# Patient Record
Sex: Male | Born: 1975 | Race: Black or African American | Hispanic: No | Marital: Married | Smoking: Current some day smoker
Health system: Southern US, Community
[De-identification: ages and names within clinical notes are randomized; demographics above are authoritative.]

## PROBLEM LIST (undated history)

## (undated) DIAGNOSIS — M1990 Unspecified osteoarthritis, unspecified site: Secondary | ICD-10-CM

## (undated) DIAGNOSIS — J45909 Unspecified asthma, uncomplicated: Secondary | ICD-10-CM

## (undated) DIAGNOSIS — K219 Gastro-esophageal reflux disease without esophagitis: Secondary | ICD-10-CM

## (undated) DIAGNOSIS — D649 Anemia, unspecified: Secondary | ICD-10-CM

## (undated) HISTORY — DX: Unspecified osteoarthritis, unspecified site: M19.90

## (undated) HISTORY — PX: OTHER SURGICAL HISTORY: SHX169

## (undated) HISTORY — DX: Anemia, unspecified: D64.9

## (undated) HISTORY — DX: Gastro-esophageal reflux disease without esophagitis: K21.9

---

## 1997-05-20 ENCOUNTER — Emergency Department (HOSPITAL_COMMUNITY): Admission: EM | Admit: 1997-05-20 | Discharge: 1997-05-20 | Payer: Self-pay | Admitting: Emergency Medicine

## 1997-05-24 ENCOUNTER — Emergency Department (HOSPITAL_COMMUNITY): Admission: EM | Admit: 1997-05-24 | Discharge: 1997-05-24 | Payer: Self-pay | Admitting: Emergency Medicine

## 1998-02-27 ENCOUNTER — Emergency Department (HOSPITAL_COMMUNITY): Admission: EM | Admit: 1998-02-27 | Discharge: 1998-02-27 | Payer: Self-pay | Admitting: Emergency Medicine

## 1998-02-27 ENCOUNTER — Encounter: Payer: Self-pay | Admitting: Emergency Medicine

## 2000-01-16 ENCOUNTER — Emergency Department (HOSPITAL_COMMUNITY): Admission: EM | Admit: 2000-01-16 | Discharge: 2000-01-16 | Payer: Self-pay | Admitting: Emergency Medicine

## 2000-01-16 ENCOUNTER — Encounter: Payer: Self-pay | Admitting: Emergency Medicine

## 2000-01-17 ENCOUNTER — Ambulatory Visit (HOSPITAL_BASED_OUTPATIENT_CLINIC_OR_DEPARTMENT_OTHER): Admission: RE | Admit: 2000-01-17 | Discharge: 2000-01-17 | Payer: Self-pay | Admitting: Orthopedic Surgery

## 2000-01-21 ENCOUNTER — Emergency Department (HOSPITAL_COMMUNITY): Admission: EM | Admit: 2000-01-21 | Discharge: 2000-01-21 | Payer: Self-pay | Admitting: Emergency Medicine

## 2000-07-02 ENCOUNTER — Emergency Department (HOSPITAL_COMMUNITY): Admission: EM | Admit: 2000-07-02 | Discharge: 2000-07-02 | Payer: Self-pay | Admitting: Emergency Medicine

## 2000-09-10 ENCOUNTER — Encounter: Payer: Self-pay | Admitting: Emergency Medicine

## 2000-09-10 ENCOUNTER — Emergency Department (HOSPITAL_COMMUNITY): Admission: EM | Admit: 2000-09-10 | Discharge: 2000-09-10 | Payer: Self-pay | Admitting: Emergency Medicine

## 2000-10-24 ENCOUNTER — Emergency Department (HOSPITAL_COMMUNITY): Admission: EM | Admit: 2000-10-24 | Discharge: 2000-10-25 | Payer: Self-pay | Admitting: Emergency Medicine

## 2000-10-24 ENCOUNTER — Encounter: Payer: Self-pay | Admitting: Emergency Medicine

## 2002-01-06 ENCOUNTER — Encounter: Admission: RE | Admit: 2002-01-06 | Discharge: 2002-01-06 | Payer: Self-pay | Admitting: Internal Medicine

## 2003-01-03 ENCOUNTER — Emergency Department (HOSPITAL_COMMUNITY): Admission: EM | Admit: 2003-01-03 | Discharge: 2003-01-03 | Payer: Self-pay | Admitting: Emergency Medicine

## 2003-05-03 ENCOUNTER — Emergency Department (HOSPITAL_COMMUNITY): Admission: EM | Admit: 2003-05-03 | Discharge: 2003-05-04 | Payer: Self-pay | Admitting: Emergency Medicine

## 2004-08-11 ENCOUNTER — Emergency Department (HOSPITAL_COMMUNITY): Admission: EM | Admit: 2004-08-11 | Discharge: 2004-08-11 | Payer: Self-pay | Admitting: Family Medicine

## 2010-02-02 ENCOUNTER — Emergency Department (HOSPITAL_COMMUNITY)
Admission: EM | Admit: 2010-02-02 | Discharge: 2010-02-02 | Payer: Self-pay | Source: Home / Self Care | Admitting: Emergency Medicine
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
## 2012-10-24 ENCOUNTER — Emergency Department (HOSPITAL_COMMUNITY)
Admission: EM | Admit: 2012-10-24 | Discharge: 2012-10-24 | Disposition: A | Discharge status: Home / Self Care | Payer: Self-pay | Attending: Emergency Medicine | Admitting: Emergency Medicine

## 2012-10-24 ENCOUNTER — Encounter (HOSPITAL_COMMUNITY): Payer: Self-pay

## 2012-10-24 DIAGNOSIS — J45909 Unspecified asthma, uncomplicated: Secondary | ICD-10-CM | POA: Insufficient documentation

## 2012-10-24 DIAGNOSIS — F17200 Nicotine dependence, unspecified, uncomplicated: Secondary | ICD-10-CM | POA: Insufficient documentation

## 2012-10-24 DIAGNOSIS — B353 Tinea pedis: Secondary | ICD-10-CM | POA: Insufficient documentation

## 2012-10-24 HISTORY — DX: Unspecified asthma, uncomplicated: J45.909

## 2012-10-24 MED ORDER — KETOCONAZOLE 2 % EX CREA: TOPICAL_CREAM | Freq: Two times a day (BID) | CUTANEOUS | Status: DC

## 2012-10-24 NOTE — ED Provider Notes (Signed)
CSN: [id number]     Arrival date & time 10/24/12  1516 History  This chart was scribed for non-physician practitioner, Wynetta Emery, PA-C, working with Gwyneth Sprout, MD by Shari Heritage, ED Scribe. This patient was seen in room TR09C/TR09C and the patient's care was started at 3:42 PM.    Chief Complaint  Patient presents with  . Foot Pain    The history is provided by the patient. No language interpreter was used.    HPI Comments: Jeffrey Kim is a 37 y.o. male who presents to the Emergency Department complaining of constant, moderate right foot pain localized between the 4th and 5th toes onset one week ago. He currently rates pain as 5-6/10. Pain is worse with palpation and bearing weight. He has taken ibuprofen for pain relief. He has also tried tinactin spray, but the states that it did not improve symptoms. Patient states that he has experienced similar pain about 1 year go, but he was never evaluated for it. He denies any other pain or symptoms at this time. He has a medical history of asthma. He is a current every day smoker. He has no PCP.    Past Medical History  Diagnosis Date  . Asthma    Past Surgical History  Procedure Laterality Date  . Hand surgey     History reviewed. No pertinent family history. History  Substance Use Topics  . Smoking status: Current Every Day Smoker  . Smokeless tobacco: Not on file  . Alcohol Use: No    Review of Systems  Constitutional: Negative for fever.  Respiratory: Negative for shortness of breath.   Cardiovascular: Negative for chest pain.  Gastrointestinal: Negative for nausea, vomiting, abdominal pain and diarrhea.  Skin: Positive for rash.  All other systems reviewed and are negative.    Allergies  Asa  Home Medications  No current outpatient prescriptions on file. Triage Vitals: BP 126/70  Pulse 83  Temp(Src) 98.2 F (36.8 C) (Oral)  Resp 18  SpO2 98%  Physical Exam  Nursing note and vitals  reviewed. Constitutional: He is oriented to person, place, and time. He appears well-developed and well-nourished. No distress.  HENT:  Head: Normocephalic.  Eyes: Conjunctivae and EOM are normal.  Cardiovascular: Normal rate.   Pulmonary/Chest: Effort normal. No stridor.  Musculoskeletal: Normal range of motion.  Neurological: He is alert and oriented to person, place, and time.  Skin:  Superficial Flaking skin in between the right fifth and fourth toes.  Psychiatric: He has a normal mood and affect.    ED Course  Procedures (including critical care time) DIAGNOSTIC STUDIES: Oxygen Saturation is 98% on room air, normal by my interpretation.    COORDINATION OF CARE: 3:45 PM- Patient informed of current plan for treatment and evaluation and agrees with plan at this time.    MDM   1. Tinea pedis, right    Filed Vitals:   10/24/12 1519  BP: 126/70  Pulse: 83  Temp: 98.2 F (36.8 C)  TempSrc: Oral  Resp: 18  SpO2: 98%     Jeffrey Kim is a 37 y.o. male with tinea pedis.   Pt is hemodynamically stable, appropriate for, and amenable to discharge at this time. Pt verbalized understanding and agrees with care plan. All questions answered. Outpatient follow-up and specific return precautions discussed.    Discharge Medication List as of 10/24/2012  3:52 PM    START taking these medications   Details  ketoconazole (NIZORAL) 2 % cream Apply topically  2 (two) times daily. Apply to affected area BID as until 1 week after lesions resolve, Starting 10/24/2012, Until Discontinued, Print        I personally performed the services described in this documentation, which was scribed in my presence. The recorded information has been reviewed and is accurate.  Note: Portions of this report may have been transcribed using voice recognition software. Every effort was made to ensure accuracy; however, inadvertent computerized transcription errors may be present    Wynetta Emery, PA-C 10/26/12 0112

## 2012-10-24 NOTE — ED Notes (Signed)
Pt c/o pain in right foot 5th digit x1 year with increasing pain x1 week.  Noted skin change between 4th and 5th digits which is also painful to touch.  Pt reports this skin change between the toes is also x1 year.

## 2012-10-24 NOTE — ED Notes (Signed)
Pt reports he has pain to his Right pinky toe x1 week. Pt reports it started between his pinky toe and now the toe itself is tender, denies hx of diabetes

## 2012-10-28 NOTE — ED Provider Notes (Signed)
Medical screening examination/treatment/procedure(s) were performed by non-physician practitioner and as supervising physician I was immediately available for consultation/collaboration.   Gwyneth Sprout, MD 10/28/12 2038

## 2014-04-10 ENCOUNTER — Ambulatory Visit (INDEPENDENT_AMBULATORY_CARE_PROVIDER_SITE_OTHER): Payer: BLUE CROSS/BLUE SHIELD | Admitting: Family Medicine

## 2014-04-10 VITALS — BP 118/72 | HR 65 | Temp 97.9°F | Resp 19 | Ht 70.0 in | Wt 197.8 lb

## 2014-04-10 DIAGNOSIS — R110 Nausea: Secondary | ICD-10-CM

## 2014-04-10 DIAGNOSIS — R197 Diarrhea, unspecified: Secondary | ICD-10-CM | POA: Diagnosis not present

## 2014-04-10 DIAGNOSIS — D649 Anemia, unspecified: Secondary | ICD-10-CM | POA: Diagnosis not present

## 2014-04-10 DIAGNOSIS — J111 Influenza due to unidentified influenza virus with other respiratory manifestations: Secondary | ICD-10-CM

## 2014-04-10 DIAGNOSIS — J45909 Unspecified asthma, uncomplicated: Secondary | ICD-10-CM | POA: Insufficient documentation

## 2014-04-10 DIAGNOSIS — R1012 Left upper quadrant pain: Secondary | ICD-10-CM

## 2014-04-10 DIAGNOSIS — R69 Illness, unspecified: Secondary | ICD-10-CM

## 2014-04-10 DIAGNOSIS — Z8709 Personal history of other diseases of the respiratory system: Secondary | ICD-10-CM

## 2014-04-10 DIAGNOSIS — R062 Wheezing: Secondary | ICD-10-CM

## 2014-04-10 LAB — COMPREHENSIVE METABOLIC PANEL
ALBUMIN: 4.4 g/dL (ref 3.5–5.2)
ALT: 17 U/L (ref 0–53)
AST: 20 U/L (ref 0–37)
Alkaline Phosphatase: 57 U/L (ref 39–117)
BILIRUBIN TOTAL: 1 mg/dL (ref 0.2–1.2)
BUN: 10 mg/dL (ref 6–23)
CO2: 31 meq/L (ref 19–32)
Calcium: 9.1 mg/dL (ref 8.4–10.5)
Chloride: 102 mEq/L (ref 96–112)
Creat: 1.15 mg/dL (ref 0.50–1.35)
GLUCOSE: 87 mg/dL (ref 70–99)
Potassium: 3.6 mEq/L (ref 3.5–5.3)
Sodium: 140 mEq/L (ref 135–145)
TOTAL PROTEIN: 7.8 g/dL (ref 6.0–8.3)

## 2014-04-10 LAB — POCT CBC
GRANULOCYTE PERCENT: 42.7 % (ref 37–80)
HCT, POC: 41.6 % — AB (ref 43.5–53.7)
HEMOGLOBIN: 13.5 g/dL — AB (ref 14.1–18.1)
Lymph, poc: 1.7 (ref 0.6–3.4)
MCH, POC: 32.8 pg — AB (ref 27–31.2)
MCHC: 32.4 g/dL (ref 31.8–35.4)
MCV: 101.4 fL — AB (ref 80–97)
MID (CBC): 0.2 (ref 0–0.9)
MPV: 7.6 fL (ref 0–99.8)
PLATELET COUNT, POC: 198 10*3/uL (ref 142–424)
POC Granulocyte: 1.4 — AB (ref 2–6.9)
POC LYMPH PERCENT: 51.6 %L — AB (ref 10–50)
POC MID %: 5.7 % (ref 0–12)
RBC: 4.1 M/uL — AB (ref 4.69–6.13)
RDW, POC: 12 %
WBC: 3.2 10*3/uL — AB (ref 4.6–10.2)

## 2014-04-10 LAB — POCT URINALYSIS DIPSTICK
Glucose, UA: NEGATIVE
KETONES UA: NEGATIVE
LEUKOCYTES UA: NEGATIVE
Nitrite, UA: NEGATIVE
Protein, UA: 30
RBC UA: NEGATIVE
Spec Grav, UA: 1.025
pH, UA: 6.5

## 2014-04-10 LAB — IRON AND TIBC
%SAT: 48 % (ref 20–55)
Iron: 158 ug/dL (ref 42–165)
TIBC: 326 ug/dL (ref 215–435)
UIBC: 168 ug/dL (ref 125–400)

## 2014-04-10 LAB — POCT INFLUENZA A/B
Influenza A, POC: NEGATIVE
Influenza B, POC: NEGATIVE

## 2014-04-10 MED ORDER — RANITIDINE HCL 150 MG PO TABS: 150.0000 mg | ORAL_TABLET | Freq: Two times a day (BID) | ORAL | Status: DC

## 2014-04-10 MED ORDER — ALBUTEROL SULFATE HFA 108 (90 BASE) MCG/ACT IN AERS: 2.0000 | INHALATION_SPRAY | Freq: Four times a day (QID) | RESPIRATORY_TRACT | Status: DC | PRN

## 2014-04-10 MED ORDER — CETIRIZINE HCL 10 MG PO TABS: 10.0000 mg | ORAL_TABLET | Freq: Every day | ORAL | Status: DC

## 2014-04-10 MED ORDER — IPRATROPIUM BROMIDE 0.02 % IN SOLN
0.5000 mg | Freq: Once | RESPIRATORY_TRACT | Status: AC
  Administered 2014-04-10: 0.5 mg via RESPIRATORY_TRACT

## 2014-04-10 MED ORDER — OMEPRAZOLE 20 MG PO CPDR
20.0000 mg | DELAYED_RELEASE_CAPSULE | Freq: Every day | ORAL | Status: DC
Start: 2014-04-10 — End: 2014-04-12

## 2014-04-10 MED ORDER — ACETAMINOPHEN 500 MG PO TABS: 1000.0000 mg | ORAL_TABLET | Freq: Four times a day (QID) | ORAL | Status: DC | PRN

## 2014-04-10 MED ORDER — E-Z SPACER DEVI: Status: DC

## 2014-04-10 MED ORDER — ALBUTEROL SULFATE (2.5 MG/3ML) 0.083% IN NEBU
2.5000 mg | INHALATION_SOLUTION | Freq: Once | RESPIRATORY_TRACT | Status: AC
  Administered 2014-04-10: 2.5 mg via RESPIRATORY_TRACT

## 2014-04-10 NOTE — Progress Notes (Signed)
04/10/2014 at 3:34 PM  Jeffrey Kim / DOB: Jun 19, 1975 / MRN: [id number]  The patient  does not have a problem list on file.  SUBJECTIVE  Chief compalaint: Nasal Congestion; Fever; Cough; and Referral   History of present illness: Jeffrey Kim is 39 y.o. smoking male well appearing male presenting for viral uri symtpoms.   He also complains of having GI distention when he eats or drinks anything along with some RUQ pain.Marland Kitchen He reports some nausea, but denies emesis.  He has frequent indigestion, but denies dysphagia and odynophagia.  He is a smoker.  He reports frequent diarrhea with some episodes of constipation and he has noticed some blood on the toilet paper, but denies frank bloody stools..  He has tried pepto bismal in the past and says this made him throw up.  He has also tried samples of omeprazole and says this helped his symptoms, but he has only taken 2 days worth of medicine at any one time. He has a history of asthma but has not had to use an inhaler for "years."   He  has a past medical history of Asthma.    He has a current medication list which includes the following prescription(s): acetaminophen, albuterol, cetirizine, ketoconazole, omeprazole, ranitidine, and e-z spacer.  Jeffrey Kim is allergic to asa. He  reports that he has been smoking.  He has never used smokeless tobacco. He reports that he does not drink alcohol or use illicit drugs. He  has no sexual activity history on file. The patient  has past surgical history that includes hand surgey.  His family history includes Hypertension in his mother; Stroke in his maternal grandmother.  Review of Systems  Constitutional: Positive for fever and chills. Negative for weight loss and malaise/fatigue.  Eyes: Negative.   Respiratory: Positive for cough. Negative for hemoptysis, sputum production, shortness of breath and wheezing.   Cardiovascular: Negative for chest pain and palpitations.  Gastrointestinal: Positive for  heartburn, nausea, abdominal pain, diarrhea, constipation and melena. Negative for vomiting.  Genitourinary: Negative.   Musculoskeletal: Positive for myalgias.  Skin: Negative for itching and rash.  Neurological: Positive for headaches. Negative for dizziness, sensory change and speech change.    OBJECTIVE  His  height is  (1.778 m) and weight is 197 lb 12.8 oz (89.721 kg). His oral temperature is 97.9 F (36.6 C). His blood pressure is 118/72 and his pulse is 65. His respiration is 19 and oxygen saturation is 97%.  The patient's body mass index is 28.38 kg/(m^2).  Physical Exam  Constitutional: He is oriented to person, place, and time. He appears well-developed and well-nourished.  Cardiovascular: Normal rate, regular rhythm and normal heart sounds.   Respiratory: Effort normal. No respiratory distress. He has wheezes. He has no rales. He exhibits no tenderness.  GI: Soft.  Musculoskeletal: Normal range of motion.  Neurological: He is alert and oriented to person, place, and time.  Skin: Skin is warm and dry.  Psychiatric: He has a normal mood and affect. His behavior is normal. Judgment and thought content normal.    Results for orders placed or performed in visit on 04/10/14 (from the past 24 hour(s))  POCT CBC     Status: Abnormal   Collection Time: 04/10/14 11:59 AM  Result Value Ref Range   WBC 3.2 (A) 4.6 - 10.2 K/uL   Lymph, poc 1.7 0.6 - 3.4   POC LYMPH PERCENT 51.6 (A) 10 - 50 %L   MID (cbc)  0.2 0 - 0.9   POC MID % 5.7 0 - 12 %M   POC Granulocyte 1.4 (A) 2 - 6.9   Granulocyte percent 42.7 37 - 80 %G   RBC 4.10 (A) 4.69 - 6.13 M/uL   Hemoglobin 13.5 (A) 14.1 - 18.1 g/dL   HCT, POC 16.1 (A) 09.6 - 53.7 %   MCV 101.4 (A) 80 - 97 fL   MCH, POC 32.8 (A) 27 - 31.2 pg   MCHC 32.4 31.8 - 35.4 g/dL   RDW, POC 04.5 %   Platelet Count, POC 198 142 - 424 K/uL   MPV 7.6 0 - 99.8 fL  POCT urinalysis dipstick     Status: None   Collection Time: 04/10/14 12:01 PM    Result Value Ref Range   Color, UA dark yellow    Clarity, UA clear    Glucose, UA negative    Bilirubin, UA small    Ketones, UA negative    Spec Grav, UA 1.025    Blood, UA negative    pH, UA 6.5    Protein, UA 30    Urobilinogen, UA >=8.0    Nitrite, UA negative    Leukocytes, UA Negative   POCT Influenza A/B     Status: None   Collection Time: 04/10/14 12:07 PM  Result Value Ref Range   Influenza A, POC Negative    Influenza B, POC Negative     ASSESSMENT & PLAN  Jeffrey Kim was seen today for nasal congestion, fever, cough and referral.  Diagnoses and all orders for this visit:  Abdominal pain, left upper quadrant Orders: -     POCT CBC -     Comprehensive metabolic panel -     H. pylori breath test -     omeprazole (PRILOSEC) 20 MG capsule; Take 1 capsule (20 mg total) by mouth daily. -     ranitidine (ZANTAC) 150 MG tablet; Take 1 tablet (150 mg total) by mouth 2 (two) times daily. -     Ambulatory referral to Gastroenterology  Nausea without vomiting Orders: -     POCT urinalysis dipstick -     Ambulatory referral to Gastroenterology  Influenza-like illness Orders: -     POCT Influenza A/B -     albuterol (PROVENTIL) (2.5 MG/3ML) 0.083% nebulizer solution 2.5 mg; Take 3 mLs (2.5 mg total) by nebulization once. -     ipratropium (ATROVENT) nebulizer solution 0.5 mg; Take 2.5 mLs (0.5 mg total) by nebulization once. -     acetaminophen (TYLENOL) 500 MG tablet; Take 2 tablets (1,000 mg total) by mouth every 6 (six) hours as needed.  Diarrhea Orders: -     TSH -     Ambulatory referral to Gastroenterology  Wheezing Orders: -     albuterol (PROVENTIL HFA;VENTOLIN HFA) 108 (90 BASE) MCG/ACT inhaler; Inhale 2 puffs into the lungs every 6 (six) hours as needed for wheezing or shortness of breath (Also use for night time cough). -     Spacer/Aero-Holding Chambers (E-Z SPACER) inhaler; Use as instructed  History of asthma Orders: -     albuterol (PROVENTIL  HFA;VENTOLIN HFA) 108 (90 BASE) MCG/ACT inhaler; Inhale 2 puffs into the lungs every 6 (six) hours as needed for wheezing or shortness of breath (Also use for night time cough). -     Spacer/Aero-Holding Chambers (E-Z SPACER) inhaler; Use as instructed -     cetirizine (ZYRTEC) 10 MG tablet; Take 1 tablet (10 mg total) by mouth  daily.  Anemia, unspecified anemia type Orders: -     Iron and TIBC -     Vitamin B12 -     Folate -     Ambulatory referral to Gastroenterology  Other orders -     Cancel: Anemia panel   I personally spent more than 45 minutes with this patient, with the majority of that time spent in discussion of his symptoms and the assessment and plan.     The patient was advised to call or come back to clinic if he does not see an improvement in symptoms, or worsens with the above plan.   Deliah BostonMichael Kaedon Fanelli, MHS, PA-C [facility] 04/10/2014 3:34 PM

## 2014-04-10 NOTE — Patient Instructions (Signed)

## 2014-04-11 ENCOUNTER — Telehealth: Payer: Self-pay | Admitting: Physician Assistant

## 2014-04-11 DIAGNOSIS — A048 Other specified bacterial intestinal infections: Secondary | ICD-10-CM

## 2014-04-11 DIAGNOSIS — R1012 Left upper quadrant pain: Secondary | ICD-10-CM

## 2014-04-11 LAB — H. PYLORI BREATH TEST: H. PYLORI BREATH TEST: DETECTED — AB

## 2014-04-11 LAB — VITAMIN B12: Vitamin B-12: 603 pg/mL (ref 211–911)

## 2014-04-11 LAB — FOLATE: FOLATE: 15.9 ng/mL

## 2014-04-11 LAB — TSH: TSH: 0.914 u[IU]/mL (ref 0.350–4.500)

## 2014-04-11 NOTE — Telephone Encounter (Signed)
Spoke with patient regarding his symptoms.  He has noted a dramatic reduction in his bloating and dyspepsia with 20 mg of omeprazole and 150 mg of ranitidine bid prn.  Informed him of his positive H. Pylori results. Advised that I will initiate therapy triple therapy for 14 days.  Patient requesting to push back his GI consult in light of these results and change in treatment plan, and given that he his have improved so dramatically on PPI and H2 blocker.  Advised that this sounded reasonable, and will check his CBC roughly 1 weeks after treatment to verify improvement in his red cell count and will repeat H. Pylori breath test after adequate cessation of PPI.  Advised that if his symptoms do not improve with or after treatment that GI consult will necessary.  [name], MS, PA-C   9:02 PM, 04/11/2014

## 2014-04-12 MED ORDER — OMEPRAZOLE 20 MG PO CPDR: 40.0000 mg | DELAYED_RELEASE_CAPSULE | Freq: Every day | ORAL | Status: DC

## 2014-04-12 MED ORDER — AMOXICILLIN 500 MG PO CAPS: 1000.0000 mg | ORAL_CAPSULE | Freq: Two times a day (BID) | ORAL | Status: AC

## 2014-04-12 MED ORDER — CLARITHROMYCIN ER 500 MG PO TB24: 500.0000 mg | ORAL_TABLET | Freq: Two times a day (BID) | ORAL | Status: AC

## 2014-04-20 ENCOUNTER — Ambulatory Visit: Payer: Self-pay | Admitting: Internal Medicine

## 2014-11-14 ENCOUNTER — Other Ambulatory Visit: Payer: Self-pay | Admitting: Physician Assistant

## 2016-12-23 ENCOUNTER — Emergency Department (HOSPITAL_COMMUNITY)
Admission: EM | Admit: 2016-12-23 | Discharge: 2016-12-23 | Disposition: A | Discharge status: Home / Self Care | Payer: No Typology Code available for payment source | Attending: Emergency Medicine | Admitting: Emergency Medicine

## 2016-12-23 ENCOUNTER — Other Ambulatory Visit: Payer: Self-pay

## 2016-12-23 ENCOUNTER — Encounter (HOSPITAL_COMMUNITY): Payer: Self-pay | Admitting: *Deleted

## 2016-12-23 DIAGNOSIS — S199XXA Unspecified injury of neck, initial encounter: Secondary | ICD-10-CM | POA: Diagnosis present

## 2016-12-23 DIAGNOSIS — Y999 Unspecified external cause status: Secondary | ICD-10-CM | POA: Diagnosis not present

## 2016-12-23 DIAGNOSIS — Y92410 Unspecified street and highway as the place of occurrence of the external cause: Secondary | ICD-10-CM | POA: Insufficient documentation

## 2016-12-23 DIAGNOSIS — J45909 Unspecified asthma, uncomplicated: Secondary | ICD-10-CM | POA: Insufficient documentation

## 2016-12-23 DIAGNOSIS — S161XXA Strain of muscle, fascia and tendon at neck level, initial encounter: Secondary | ICD-10-CM | POA: Insufficient documentation

## 2016-12-23 DIAGNOSIS — Y9389 Activity, other specified: Secondary | ICD-10-CM | POA: Diagnosis not present

## 2016-12-23 DIAGNOSIS — F17200 Nicotine dependence, unspecified, uncomplicated: Secondary | ICD-10-CM | POA: Insufficient documentation

## 2016-12-23 DIAGNOSIS — Z79899 Other long term (current) drug therapy: Secondary | ICD-10-CM | POA: Diagnosis not present

## 2016-12-23 MED ORDER — METHOCARBAMOL 500 MG PO TABS: 1000.0000 mg | ORAL_TABLET | Freq: Three times a day (TID) | ORAL | 0 refills | Status: DC | PRN

## 2016-12-23 MED ORDER — IBUPROFEN 600 MG PO TABS
600.0000 mg | ORAL_TABLET | Freq: Three times a day (TID) | ORAL | 0 refills | Status: DC | PRN
Start: 2016-12-23 — End: 2020-12-12

## 2016-12-23 NOTE — ED Notes (Signed)
Placed in phili collar.
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
## 2019-05-08 ENCOUNTER — Ambulatory Visit: Payer: Medicaid Other | Attending: Internal Medicine

## 2019-05-08 DIAGNOSIS — Z23 Encounter for immunization: Secondary | ICD-10-CM

## 2019-05-08 NOTE — Progress Notes (Signed)
   Covid-19 Vaccination Clinic  Name:  JAMARRIUS SALAY    MRN: [id number] DOB: [birth year]  05/08/2019  Mr. Dubree was observed post Covid-19 immunization for 15 minutes without incident. He was provided with Vaccine Information Sheet and instruction to access the V-Safe system.   Mr. Alejandro was instructed to call 911 with any severe reactions post vaccine: Marland Kitchen Difficulty breathing  . Swelling of face and throat  . A fast heartbeat  . A bad rash all over body  . Dizziness and weakness   Immunizations Administered    Name Date Dose VIS Date Route   Moderna COVID-19 Vaccine 05/08/2019 10:57 AM 0.5 mL 12/29/2018 Intramuscular   Manufacturer: Moderna   Lot: [id number]   NDC: 78478-412-82

## 2019-06-05 ENCOUNTER — Ambulatory Visit: Payer: Medicaid Other | Attending: Internal Medicine

## 2019-06-05 DIAGNOSIS — Z23 Encounter for immunization: Secondary | ICD-10-CM

## 2019-06-05 NOTE — Progress Notes (Signed)
   Covid-19 Vaccination Clinic  Name:  Jeffrey Kim    MRN: [id number] DOB: 1975-08-09  06/05/2019  [name] was observed post Covid-19 immunization for 15 minutes without incident. He was provided with Vaccine Information Sheet and instruction to access the V-Safe system.   [name] was instructed to call 911 with any severe reactions post vaccine: Marland Kitchen Difficulty breathing  . Swelling of face and throat  . A fast heartbeat  . A bad rash all over body  . Dizziness and weakness   Immunizations Administered    Name Date Dose VIS Date Route   Moderna COVID-19 Vaccine 06/05/2019 10:21 AM 0.5 mL 12/2018 Intramuscular   Manufacturer: Moderna   Lot: [id number]   NDC: 09735-329-92

## 2020-11-23 ENCOUNTER — Encounter: Payer: Self-pay | Admitting: Student

## 2020-11-23 DIAGNOSIS — M1990 Unspecified osteoarthritis, unspecified site: Secondary | ICD-10-CM | POA: Insufficient documentation

## 2020-12-09 NOTE — Progress Notes (Signed)
Subjective:    Patient ID: Jeffrey Kim, male    DOB: [birth year], 45 y.o.   MRN: [id number]   CC: New Patient with concerns of stomach pain and difficulty eating  HPI: 45 y/o M presenting with 10+ years of stomach pain which has worsened in the last year. He states he has not been eating often as he gets nauseous with most foods and feels as if he is going to vomit. Every time he eats, he can hear his stomach over the TV or his wife can hear it. Sometimes he experiences pain like he is being stabbed.  He notes he weighed 226 lbs at the beginning of this year, now weighs 185. Ginger ale and sprite are his best friend. He frequently experiences stomach acid that burns when it comes up and often has metallic taste in his mouth. A good day would be if he gets one full meal. He notes some weeks he only drinks as he can't tolerate food. He is frequently awoken in the morning by stomach pain and therefore doesn't get more than 5 hours of sleep a night. He states he was evaluated for this 10+ years ago and was given Prilosec which didn't work. Denies any medication usage other than Naproxen 2-3 tabs a few times a week for back pain. He denies any blood in his vomit. He does have difficulty with alternating constipation/diarrhea. Denies any blood in stool or urine.  Denies any cancer hx in his family. He smokes 3-4 cigars/day x 10 years. No alcohol use. No illicit drug use other than THC for back pain. He has never had EGD or colonoscopy.   Pt also presents with concerns of congestion and spot on his left lower leg. Deferred to another visit.  Wife present during entirety of visit.   PMHx: Past Medical History:  Diagnosis Date   Acid reflux    Anemia    Arthritis    Asthma      Surgical Hx: Past Surgical History:  Procedure Laterality Date   hand surgey       Family Hx: Family History  Problem Relation Age of Onset   Hypertension Mother    Hypertension Brother    Stroke  Maternal Grandmother      Social Hx: Current Social History   (Please include date ( .td) when updating information )  Who lives at home: wife, twin daughters, his mother 12/12/2020  Who would speak for you about health care matters: himself and wife 12/12/2020  Transportation: himself 12/12/2020 Important Relationships & Pets: fishtank 12/12/2020  Work / Education:  [employer]a [employer] for [employer] 12/12/2020 Religious / Personal Beliefs: none 12/12/2020   Medications: Aleve  ROS: Man:  -fever, +weight change, -difficulty swallowing, 3-4 hours of sleep/night, -chest pain, -SOB, +heart burn,   Objective:  BP 110/77   Pulse 68   Wt 185 lb 9.6 oz (84.2 kg)   SpO2 100%   BMI 26.63 kg/m  Vitals and nursing note reviewed  General: No acute distress, capable in conversation HEENT: moist mucous membranes, good dentition without erythema or discharge noted in posterior oropharynx Neck: supple, non-tender, without lymphadenopathy Cardiac: RRR, clear S1 and S2, no murmurs, rubs, or gallops Respiratory: clear to auscultation bilaterally, no increased work of breathing Abdomen: flat, epigastric tenderness, bowel sounds present throughout  Assessment & Plan:  Epigastric pain Concerning for obstructive or malignant process in the setting of 41 lb weight loss, limited ability to tolerate PO intake, and persistent nausea. Unlikely  to be severe GERD. Seriousness of sxs relayed to pt and his wife. CT abdomen w/ contrast scheduled for 12/22/20. Referral submitted for EGD. CMet and CBC ordered today. Advised limiting Naproxen as that may worsen GERD.   Abnormal weight loss Encouraged protein or ensure shakes or increasing protein intake if able.   Return in about 3 weeks (around 01/02/2021) for Epigastric pain. Return as needed for congestion and spot on leg.   Shelby Mattocks, DO 12/12/2020, 5:02 PM PGY-1, [facility]

## 2020-12-12 ENCOUNTER — Ambulatory Visit (INDEPENDENT_AMBULATORY_CARE_PROVIDER_SITE_OTHER): Payer: Self-pay | Admitting: Student

## 2020-12-12 ENCOUNTER — Encounter: Payer: Self-pay | Admitting: Student

## 2020-12-12 ENCOUNTER — Other Ambulatory Visit: Payer: Self-pay

## 2020-12-12 VITALS — BP 110/77 | HR 68 | Wt 185.6 lb

## 2020-12-12 DIAGNOSIS — R634 Abnormal weight loss: Secondary | ICD-10-CM

## 2020-12-12 DIAGNOSIS — R110 Nausea: Secondary | ICD-10-CM

## 2020-12-12 DIAGNOSIS — R1013 Epigastric pain: Secondary | ICD-10-CM | POA: Insufficient documentation

## 2020-12-12 DIAGNOSIS — R198 Other specified symptoms and signs involving the digestive system and abdomen: Secondary | ICD-10-CM | POA: Insufficient documentation

## 2020-12-12 NOTE — Assessment & Plan Note (Addendum)
Concerning for obstructive or malignant process in the setting of 41 lb weight loss, limited ability to tolerate PO intake, and persistent nausea. Unlikely to be only severe GERD. Seriousness of sxs relayed to pt and his wife. CT abdomen w/ contrast scheduled for 12/22/20. Referral submitted for EGD. CMet and CBC ordered today. Advised limiting Naproxen as that may worsen GERD sxs. Consider colonoscopy at later date as well.

## 2020-12-12 NOTE — Patient Instructions (Signed)
He lostIt was great to see you today! Thank you for choosing [facility] for your primary care. Jeffrey Kim was seen for and abdominal pain.  Our plans for today were:  -I referred you to GI to get an upper endoscopy.  We will check some blood work today to get a baseline.  We are in the process of scheduling you for a CT of your abdomen and will give you a call when we have that scheduled. -Apology for coming in today as this concern is important to follow-up on.  I would advise you attempt to take in more protein (such as Ensure shakes) if you are able.  Additionally, THC use can be correlated to increased nausea and decreasing or full cessation of THC may benefit you.  I would advise Tylenol use instead of naproxen as naproxen may aggravate the stomach and also may be contributing to your nausea/reflux.  We are checking some labs today. If they are abnormal, I will call you. If they are normal, I will send you a MyChart message or a letter. If you do not hear about your labs in the next 2 weeks, please let us know.  You should return to our clinic in 3 to 4 weeks for epigastric pain and weight loss follow-up.  You may also schedule another appointment for the congestion and the spot on your left leg so that we may appropriately address those.  Please arrive 15 minutes before your appointment to ensure smooth check in process.  We appreciate your efforts in making this happen.  Take care and seek immediate care sooner if you develop any concerns.   Thank you for allowing me to participate in your care, Shelby Mattocks, DO 12/12/2020, 11:00 AM PGY-1, [facility]

## 2020-12-12 NOTE — Assessment & Plan Note (Signed)
Encouraged protein or ensure shakes or increasing protein intake if able.

## 2020-12-13 LAB — COMPREHENSIVE METABOLIC PANEL
ALT: 10 IU/L (ref 0–44)
AST: 14 IU/L (ref 0–40)
Albumin/Globulin Ratio: 2 (ref 1.2–2.2)
Albumin: 4.2 g/dL (ref 4.0–5.0)
Alkaline Phosphatase: 64 IU/L (ref 44–121)
BUN/Creatinine Ratio: 9 (ref 9–20)
BUN: 9 mg/dL (ref 6–24)
Bilirubin Total: 0.3 mg/dL (ref 0.0–1.2)
CO2: 25 mmol/L (ref 20–29)
Calcium: 9.3 mg/dL (ref 8.7–10.2)
Chloride: 105 mmol/L (ref 96–106)
Creatinine, Ser: 1.05 mg/dL (ref 0.76–1.27)
Globulin, Total: 2.1 g/dL (ref 1.5–4.5)
Glucose: 81 mg/dL (ref 70–99)
Potassium: 5.3 mmol/L — ABNORMAL HIGH (ref 3.5–5.2)
Sodium: 144 mmol/L (ref 134–144)
Total Protein: 6.3 g/dL (ref 6.0–8.5)
eGFR: 89 mL/min/{1.73_m2} (ref >59)

## 2020-12-13 LAB — CBC WITH DIFFERENTIAL/PLATELET
Basophils Absolute: 0 10*3/uL (ref 0.0–0.2)
Basos: 0 %
EOS (ABSOLUTE): 0.1 10*3/uL (ref 0.0–0.4)
Eos: 1 %
Hematocrit: 36.1 % — ABNORMAL LOW (ref 37.5–51.0)
Hemoglobin: 12.6 g/dL — ABNORMAL LOW (ref 13.0–17.7)
Immature Grans (Abs): 0 10*3/uL (ref 0.0–0.1)
Immature Granulocytes: 0 %
Lymphocytes Absolute: 1.5 10*3/uL (ref 0.7–3.1)
Lymphs: 27 %
MCH: 35.5 pg — ABNORMAL HIGH (ref 26.6–33.0)
MCHC: 34.9 g/dL (ref 31.5–35.7)
MCV: 102 fL — ABNORMAL HIGH (ref 79–97)
Monocytes Absolute: 0.5 10*3/uL (ref 0.1–0.9)
Monocytes: 9 %
Neutrophils Absolute: 3.6 10*3/uL (ref 1.4–7.0)
Neutrophils: 63 %
Platelets: 210 10*3/uL (ref 150–450)
RBC: 3.55 x10E6/uL — ABNORMAL LOW (ref 4.14–5.80)
RDW: 11 % — ABNORMAL LOW (ref 11.6–15.4)
WBC: 5.7 10*3/uL (ref 3.4–10.8)

## 2020-12-14 ENCOUNTER — Telehealth: Payer: Self-pay | Admitting: Student

## 2020-12-14 NOTE — Telephone Encounter (Signed)
Spoke with patient regarding his most recent labs.  Elevated potassium and mildly anemic likely correlated to NSAID use.  Patient has already stopped taking naproxen as advised during clinic visit.  Advised patient to use Tylenol if he is experiencing any pain.  Patient stated understanding of instructions.  He has not yet been reached out to 4 scheduling of his EGD.  Patient aware that he has imaging appointment for CT abdomen on 12/22/2020.

## 2020-12-20 ENCOUNTER — Ambulatory Visit (INDEPENDENT_AMBULATORY_CARE_PROVIDER_SITE_OTHER): Payer: Self-pay | Admitting: Family Medicine

## 2020-12-20 ENCOUNTER — Other Ambulatory Visit: Payer: Self-pay

## 2020-12-20 ENCOUNTER — Encounter: Payer: Self-pay | Admitting: Family Medicine

## 2020-12-20 ENCOUNTER — Telehealth: Payer: Self-pay

## 2020-12-20 ENCOUNTER — Ambulatory Visit
Admission: RE | Admit: 2020-12-20 | Discharge: 2020-12-20 | Disposition: A | Discharge status: Home / Self Care | Payer: Medicaid Other | Source: Ambulatory Visit | Attending: Family Medicine | Admitting: Family Medicine

## 2020-12-20 VITALS — BP 120/74 | HR 65 | Ht 70.0 in | Wt 187.5 lb

## 2020-12-20 DIAGNOSIS — L819 Disorder of pigmentation, unspecified: Secondary | ICD-10-CM

## 2020-12-20 DIAGNOSIS — D7589 Other specified diseases of blood and blood-forming organs: Secondary | ICD-10-CM

## 2020-12-20 DIAGNOSIS — D539 Nutritional anemia, unspecified: Secondary | ICD-10-CM

## 2020-12-20 DIAGNOSIS — L0232 Furuncle of buttock: Secondary | ICD-10-CM

## 2020-12-20 DIAGNOSIS — R0981 Nasal congestion: Secondary | ICD-10-CM

## 2020-12-20 DIAGNOSIS — L989 Disorder of the skin and subcutaneous tissue, unspecified: Secondary | ICD-10-CM

## 2020-12-20 NOTE — Progress Notes (Signed)
    SUBJECTIVE:   CHIEF COMPLAINT / HPI: sinus congestion, spot on leg, spot on buttock  Sinus congestion Reports has had congestion since first COVID vaccine 05/08/19.  Denies any fevers, productive cough, chest pain, nausea, vomiting or shortness of breath.  No recent sick contacts.  Endorses runny nose, ear popping and intermittent headaches without any numbness, weakness, slurred speech or visual disturbances.  Has seasonal allergies for which he used to take Flonase but stopped as not effective.  Reports history of shellfish allergy, swelling of tongue and lips.    Spot on left leg Has been there since was teenager. Has now gotten bigger and is painful.  No change in color, shape and is flat.  Denies any fevers, joint pain or family history of autoimmune disorders.  Has smaller areas on body but not painful and more like moles.  Works with marine life, has own store with aquariums.    Ope sore on right buttock Small open area on medial side of right buttock.  Was a small lump that was itchy.  Scratched and opened.  Wife put ointment on it.  Has since gotten smaller but painful to sit on.  Denies any insect bite, trauma, fevers or recurrent drainage.  PERTINENT  PMH / PSH:  Chronic abdominal pain, awaiting GI evaluation  OBJECTIVE:   BP 120/74   Pulse 65   Ht _0  (1.778 m)   Wt 187 lb 8 oz (85 kg)   SpO2 100%   BMI 26.90 kg/m    General: Alert, no acute distress Cardio: Normal S1 and S2, RRR, no r/m/g Pulm: CTAB, normal work of breathing Left lower extremity: small dark flat circular lesion that is painful to light touch   Right Buttock: small open area with scab noted, indurated without fluctuation or drainage     ASSESSMENT/PLAN:   Painful skin lesion Low suspicion for malignancy given no asymmetry, irregularity or elevated borders.  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  Last Folate wnl -Will repeat CBC today -Vit B 12, Folate today -Follow up with PCP in 2 weeks  Furuncle of buttock Healing in process but continues to have pain when sitting. 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

## 2020-12-20 NOTE — Patient Instructions (Signed)
Thank you for coming to see me today. It was a pleasure. Today we talked about:   Follow up with GI to schedule an appointment for your abdominal pain  Address: [address], [place], [place] [address] Phone: [phone]   [facility] to get contrast for CT abdomen scheduled for Friday  We will get some labs today.  If they are abnormal or we need to do something about them, I will call you.  If they are normal, I will send you a message on MyChart (if it is active) or a letter in the mail.  If you don't hear from [place] in 2 weeks, please call the office at the number below.   I have placed an order for chest xray.  Please go to [facility] or at [facility] to have this completed.  You do not need an appointment, but if you would like to call them beforehand, their number is [phone].  We will contact you with your results afterwards.    Warm compresses to buttock as needed.  Can take Tylenol for discomfort  Please follow-up with PCP in 2 weeks  If you have any questions or concerns, please do not hesitate to call the office at [phone].  Best,   Carollee Leitz, MD    Erythema Nodosum Erythema nodosum is a skin condition in which patches of fat under the skin of the lower legs become inflamed. This causes painful bumps (nodules) to form. What are the causes? This condition may be caused by: Infections. Strep throat (pharyngitis) is a common cause. Certain medicines, especially birth control pills, penicillin, and sulfa medicines. Sarcoidosis. Pregnancy. Certain inflammatory conditions, such as Crohn's disease. Certain cancers. In some cases, the cause may not be known. What increases the risk? This condition is more likely to develop in young adult women. This may be related to birth control pills. What are the signs or symptoms? The main symptom of this condition is the development of nodules that: Look like raised bruises and are  tender to the touch. Usually appear on the front of the lower legs (shins) and may also appear on the arms or the abdomen. Gradually change in color from pink to brown. Leave a dark mark that fades away after several months. Other symptoms besides nodules may include: Fever. Tiredness(fatigue). Joint pain. How is this diagnosed? This condition may be diagnosed based on: Your symptoms and your medical history. A physical exam. Tests, such as: Blood tests. X-rays. A skin sample may be removed (skin biopsy) to be examined by a specialist (pathologist). How is this treated? Treatment for this condition depends on the cause. The nodules usually go away after the underlying cause is treated, such as after medicine is used to fight infection. Treatment may also include: NSAIDs, such as ibuprofen, for pain and inflammation. Potassium iodide supplements. Steroid medicines. Resting and raising (elevating) the affected legs. Follow these instructions at home: Medicines Take over-the-counter and prescription medicines only as told by your health care provider. If you were prescribed an antibiotic medicine, take or apply it as told by your health care provider. Do not stop using the antibiotic even if you start to feel better. Managing pain, stiffness, and swelling  If directed, put ice on the affected area. To do this: Put ice in a plastic bag. Place a towel between your skin and the bag. Leave the ice on for 20 minutes, 2-3 times a day. Remove the ice  if your skin turns bright red. This is very important. If you cannot feel pain, heat, or cold, you have a greater risk of damage to the area. Elevate the affected area above the level of your heart while you are sitting or lying down. Wear a compression bandage as told by your health care provider. Activity Rest and return to your normal activities as told by your health care provider. Ask your health care provider what activities are safe for  you. Avoid very intense (vigorous) exercise until your symptoms go away. General instructions Avoid scratching your skin. To relieve itchiness, make a paste with dry oatmeal and warm water, then put the paste on itchy areas. Let the paste dry, remove it, and then apply moisturizer. You may do this 2-3 times a day, or as needed. Keep all follow-up visits. This is important. Contact a health care provider if you: Have symptoms that do not get better with treatment and home care. Have a fever that does not go away. Vomit more than one time. Have pain that gets worse. Have a sore throat. Summary Erythema nodosum is a skin condition that causes painful bumps (nodules) to form. The bumps usually appear on the front of the lower legs (shins). Avoid very intense (vigorous) exercise until your symptoms go away. Contact a health care provider if you have a fever or if your symptoms do not improve. This information is not intended to replace advice given to you by your health care provider. Make sure you discuss any questions you have with your health care provider. Document Revised: 03/29/2020 Document Reviewed: 03/29/2020 Elsevier Patient Education  2022 [employer].

## 2020-12-20 NOTE — Telephone Encounter (Signed)
Patients wife calls nurse line stating (2) prescriptions were supposed to be called in after OV today.  Wife reports Flonase and "something else." Wife apologizes she does not know what the medications are.   Will forward to provider who saw patent.

## 2020-12-21 LAB — CBC
Hematocrit: 35.5 % — ABNORMAL LOW (ref 37.5–51.0)
Hemoglobin: 12 g/dL — ABNORMAL LOW (ref 13.0–17.7)
MCH: 34.6 pg — ABNORMAL HIGH (ref 26.6–33.0)
MCHC: 33.8 g/dL (ref 31.5–35.7)
MCV: 102 fL — ABNORMAL HIGH (ref 79–97)
Platelets: 204 10*3/uL (ref 150–450)
RBC: 3.47 x10E6/uL — ABNORMAL LOW (ref 4.14–5.80)
RDW: 11.1 % — ABNORMAL LOW (ref 11.6–15.4)
WBC: 6.1 10*3/uL (ref 3.4–10.8)

## 2020-12-21 LAB — COMPREHENSIVE METABOLIC PANEL
ALT: 7 IU/L (ref 0–44)
AST: 10 IU/L (ref 0–40)
Albumin/Globulin Ratio: 2 (ref 1.2–2.2)
Albumin: 4.5 g/dL (ref 4.0–5.0)
Alkaline Phosphatase: 60 IU/L (ref 44–121)
BUN/Creatinine Ratio: 11 (ref 9–20)
BUN: 12 mg/dL (ref 6–24)
Bilirubin Total: 0.4 mg/dL (ref 0.0–1.2)
CO2: 26 mmol/L (ref 20–29)
Calcium: 9.2 mg/dL (ref 8.7–10.2)
Chloride: 104 mmol/L (ref 96–106)
Creatinine, Ser: 1.12 mg/dL (ref 0.76–1.27)
Globulin, Total: 2.2 g/dL (ref 1.5–4.5)
Glucose: 88 mg/dL (ref 70–99)
Potassium: 4.6 mmol/L (ref 3.5–5.2)
Sodium: 143 mmol/L (ref 134–144)
Total Protein: 6.7 g/dL (ref 6.0–8.5)
eGFR: 83 mL/min/{1.73_m2} (ref >59)

## 2020-12-21 LAB — ANTISTREPTOLYSIN O TITER: ASO: 173 IU/mL (ref 0.0–200.0)

## 2020-12-21 LAB — SEDIMENTATION RATE: Sed Rate: 4 mm/hr (ref 0–15)

## 2020-12-21 LAB — FOLATE: Folate: 4.7 ng/mL (ref >3.0)

## 2020-12-21 LAB — VITAMIN B12: Vitamin B-12: 359 pg/mL (ref 232–1245)

## 2020-12-21 LAB — C-REACTIVE PROTEIN: CRP: 1 mg/L (ref 0–10)

## 2020-12-22 ENCOUNTER — Other Ambulatory Visit: Payer: Self-pay

## 2020-12-22 ENCOUNTER — Ambulatory Visit (HOSPITAL_COMMUNITY)
Admission: RE | Admit: 2020-12-22 | Discharge: 2020-12-22 | Disposition: A | Discharge status: Home / Self Care | Payer: Self-pay | Source: Ambulatory Visit | Attending: Family Medicine | Admitting: Family Medicine

## 2020-12-22 DIAGNOSIS — R634 Abnormal weight loss: Secondary | ICD-10-CM

## 2020-12-22 DIAGNOSIS — R1013 Epigastric pain: Secondary | ICD-10-CM

## 2020-12-22 MED ORDER — IOHEXOL 350 MG/ML SOLN
80.0000 mL | Freq: Once | INTRAVENOUS | Status: AC | PRN
  Administered 2020-12-22: 80 mL via INTRAVENOUS

## 2020-12-24 ENCOUNTER — Encounter: Payer: Self-pay | Admitting: Family Medicine

## 2020-12-24 DIAGNOSIS — D539 Nutritional anemia, unspecified: Secondary | ICD-10-CM | POA: Insufficient documentation

## 2020-12-24 DIAGNOSIS — L0232 Furuncle of buttock: Secondary | ICD-10-CM | POA: Insufficient documentation

## 2020-12-24 DIAGNOSIS — R0981 Nasal congestion: Secondary | ICD-10-CM | POA: Insufficient documentation

## 2020-12-24 DIAGNOSIS — L989 Disorder of the skin and subcutaneous tissue, unspecified: Secondary | ICD-10-CM | POA: Insufficient documentation

## 2020-12-24 MED ORDER — CETIRIZINE HCL 10 MG PO TABS: 10.0000 mg | ORAL_TABLET | Freq: Every day | ORAL | 11 refills | Status: AC

## 2020-12-24 MED ORDER — FLUTICASONE PROPIONATE 50 MCG/ACT NA SUSP: 2.0000 | Freq: Every day | NASAL | 0 refills | Status: AC

## 2020-12-24 MED ORDER — EPINEPHRINE 0.3 MG/0.3ML IJ SOAJ: 0.3000 mg | INTRAMUSCULAR | 0 refills | Status: AC | PRN

## 2020-12-24 NOTE — Assessment & Plan Note (Signed)
Healing in process but continues to have pain when sitting. -Sitz baths or warm compresses as needed -Strict return precautions provided -Follow up with PCP in 2 weeks or sooner if symptoms worsen

## 2020-12-24 NOTE — Assessment & Plan Note (Signed)
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

## 2020-12-24 NOTE — Assessment & Plan Note (Signed)
Low suspicion for malignancy given no asymmetry, irregularity or elevated borders.  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

## 2020-12-24 NOTE — Assessment & Plan Note (Signed)
MCV >100, Hbg 12.6.  Last Folate wnl -Will repeat CBC today -Vit B 12, Folate today -Follow up with PCP in 2 weeks

## 2021-01-01 ENCOUNTER — Telehealth: Payer: Self-pay | Admitting: Student

## 2021-01-01 ENCOUNTER — Telehealth: Payer: Self-pay | Admitting: *Deleted

## 2021-01-01 NOTE — Telephone Encounter (Signed)
-----   Message from Shelby Mattocks, DO sent at 01/01/2021  3:35 PM EST ----- This patient was to be scheduled for an EGD and subsequently also perhaps a colonoscopy for his abdominal pain and weight loss that he was experiencing.  Patient has not yet been scheduled for EGD.  Can someone please reach out for the referral to have patient scheduled for EGD and colonoscopy? If you need any more referral orders from me, let me know.  Thanks.

## 2021-01-01 NOTE — Telephone Encounter (Signed)
Discussed CT results with patient (verified date of birth).  Advised that he has gallstones but everything else was normal and likely does not correspond to the symptoms that he has been experiencing.  He confirmed that he has not been reached out to you regarding EGD or colonoscopy scheduling.  Advised patient I would reach out to my office staff to get patient probably scheduled for these.

## 2021-01-01 NOTE — Telephone Encounter (Signed)
Referral has been placed in [place] and they will contact pt, however most offices are scheduling several months out. Will also route to Referral Coordinator to see if she needs to resend or not.  Will also contact pt and provide him with the number to the office he was referred to so he can reach out to them about scheduling. Mayra Brahm Zimmerman Rumple, CMA

## 2021-01-02 NOTE — Telephone Encounter (Signed)
Patient voiced understanding and will plan to call them in a few weeks to schedule an appointment. Darris Carachure,CMA

## 2021-01-12 ENCOUNTER — Ambulatory Visit: Payer: Medicaid Other | Admitting: Family Medicine

## 2021-01-18 ENCOUNTER — Ambulatory Visit: Payer: Medicaid Other | Admitting: Student

## 2021-01-19 ENCOUNTER — Ambulatory Visit: Payer: Medicaid Other | Admitting: Family Medicine

## 2021-07-03 ENCOUNTER — Encounter: Payer: Self-pay | Admitting: *Deleted

## 2022-12-13 IMAGING — CT CT ABDOMEN W/ CM
3 of 5 series · 13 of 46 positions shown, 19 images · IV contrast (OMNIPAQUE 350)
Comparison: None.

CLINICAL DATA: Epigastric pain for 1 year. 30 lb weight loss in
past 10 months. Alternating constipation and diarrhea.

EXAM:
CT ABDOMEN WITH CONTRAST
TECHNIQUE: Multidetector CT imaging of the abdomen was performed using the
standard protocol following bolus administration of intravenous
contrast.
CONTRAST:  80mL OMNIPAQUE IOHEXOL 350 MG/ML SOLN

[Series 2: axial st · axial · 0.76mm/px · z∈[+1250,+1470]mm · 9 of 56 slices shown, 15 images]
[im 6/56  soft-tissue]
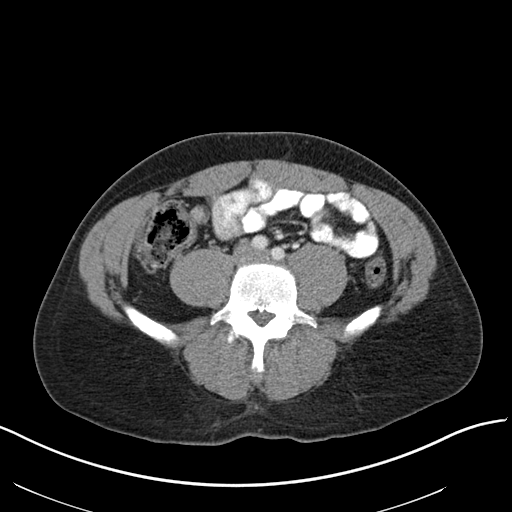
[im 6/56  bone]
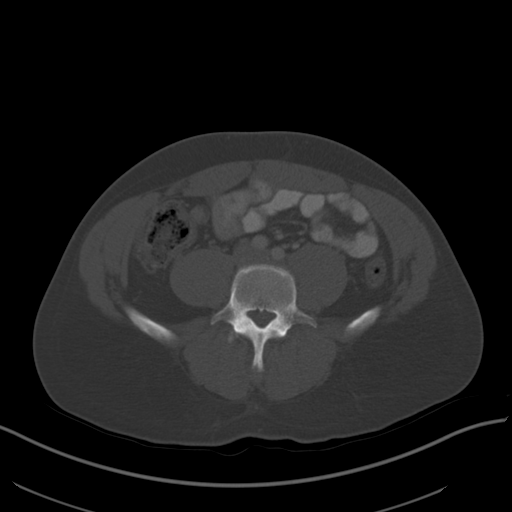
[im 12/56  soft-tissue]
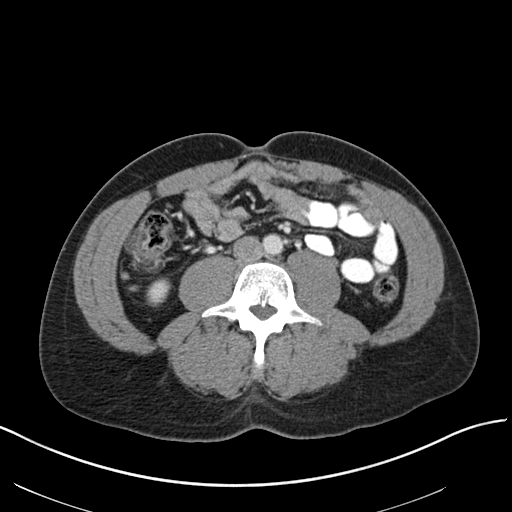
[im 17/56  soft-tissue]
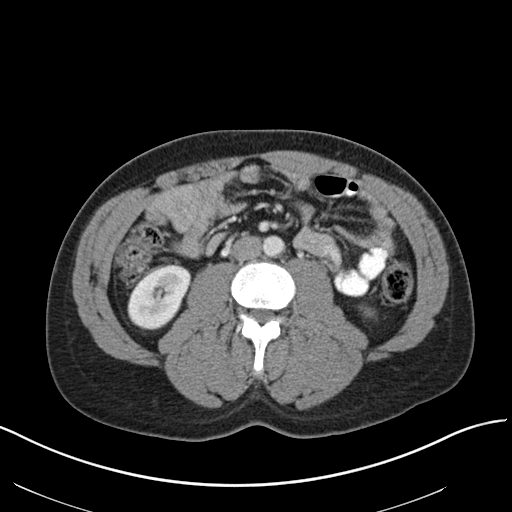
[im 23/56  soft-tissue]
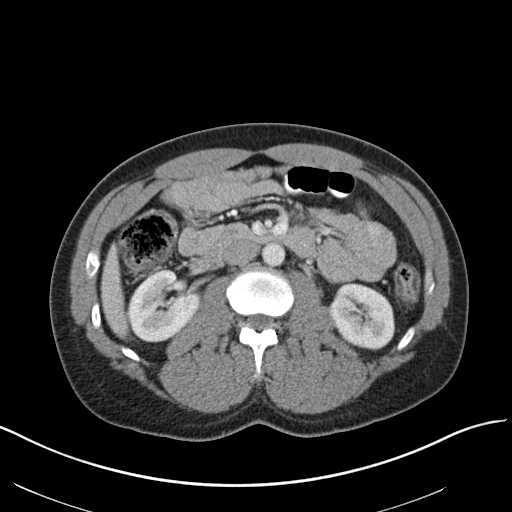
[im 28/56  soft-tissue]
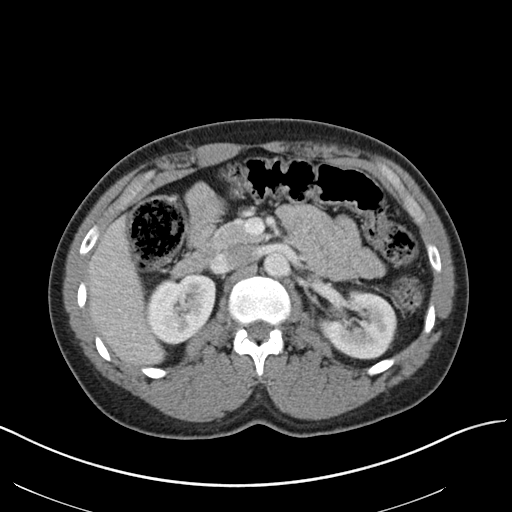
[im 34/56  soft-tissue]
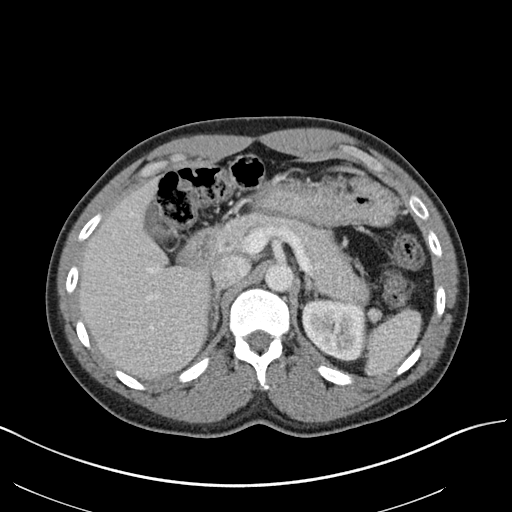
[im 34/56  lung]
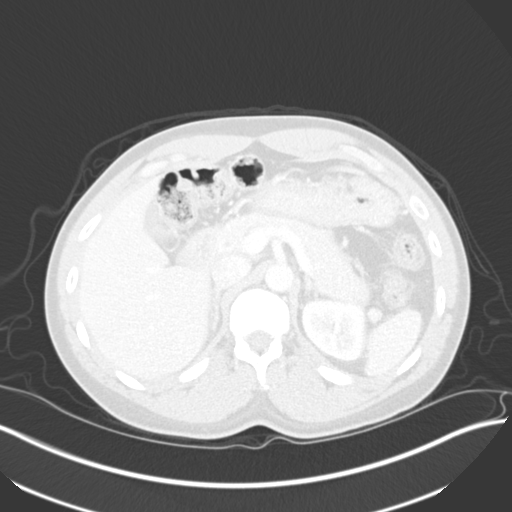
[im 39/56  soft-tissue]
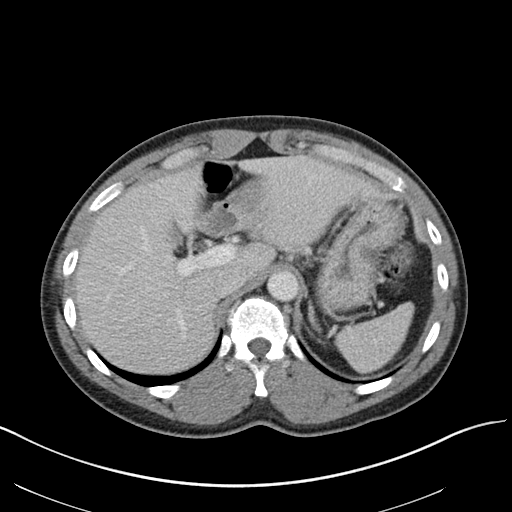
[im 39/56  lung]
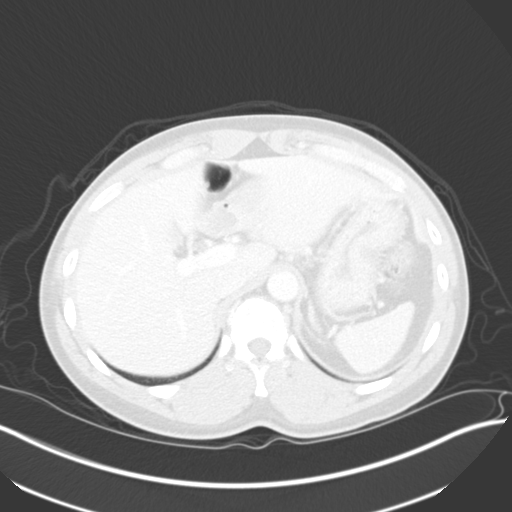
[im 45/56  soft-tissue]
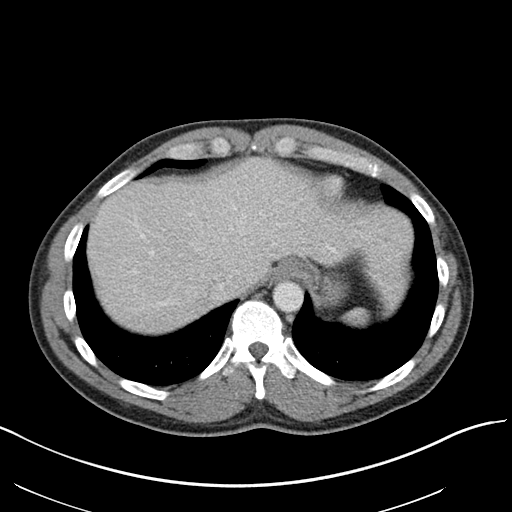
[im 45/56  lung]
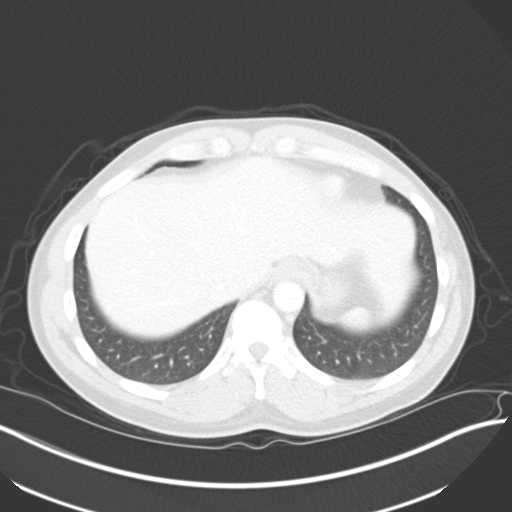
[im 50/56  soft-tissue]
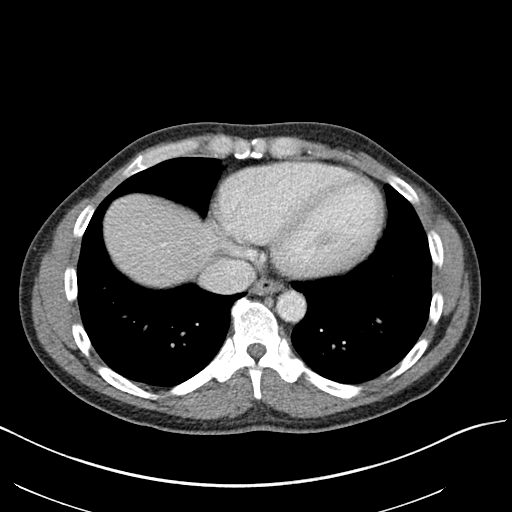
[im 50/56  lung]
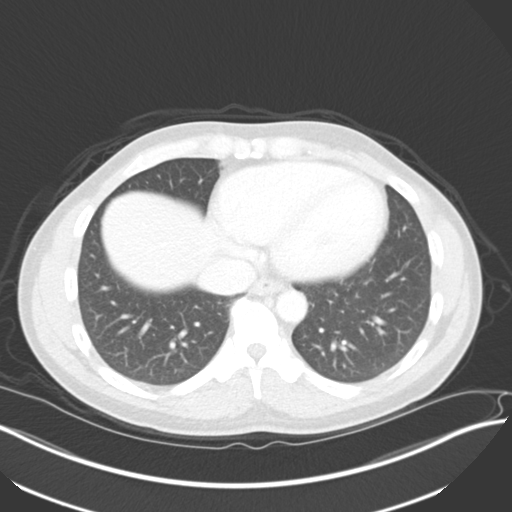
[im 50/56  bone]
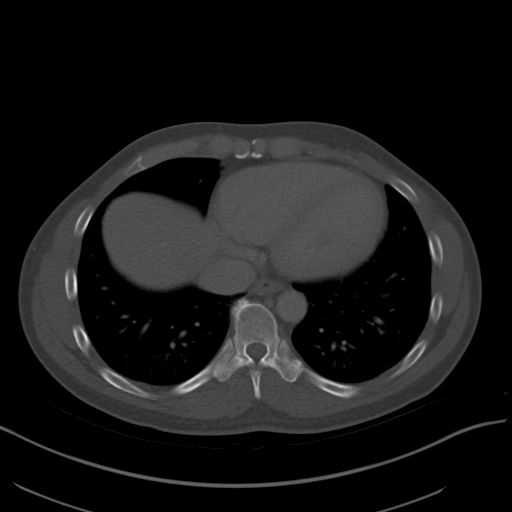

[Series 5: coronal st · coronal · 0.60mm/px · 3 of 96 slices shown]
[im 32/96  soft-tissue]
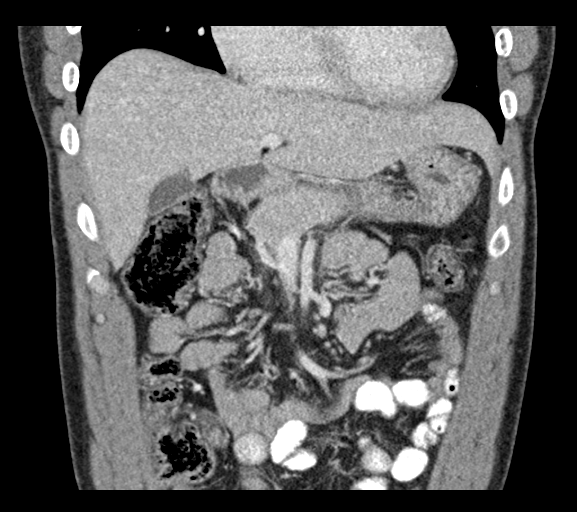
[im 43/96  soft-tissue]
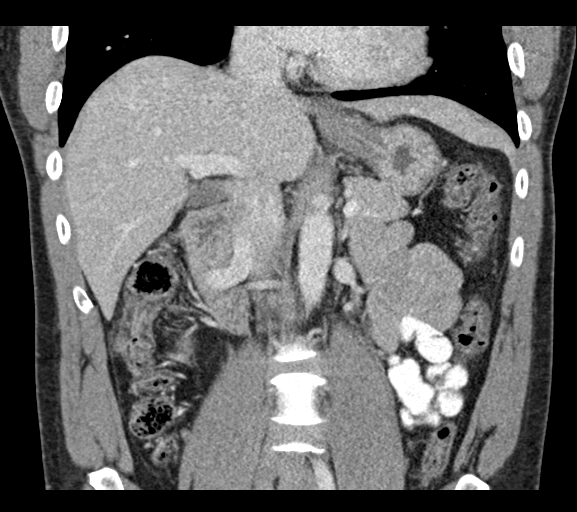
[im 53/96  soft-tissue]
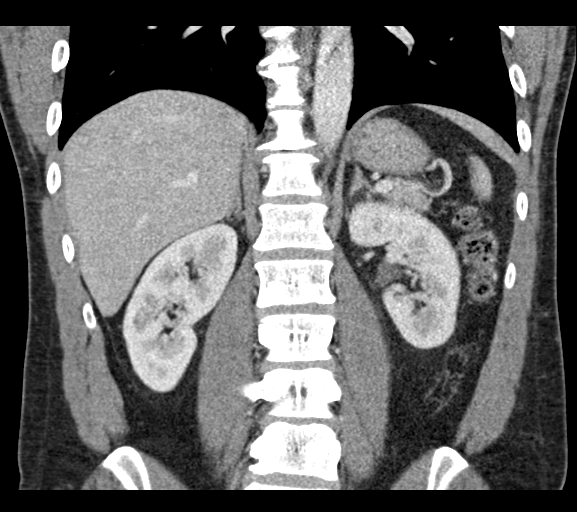

[Series 6: sagittal st · sagittal · 0.59mm/px · 1 of 116 slices shown]
[im 39/116  soft-tissue]
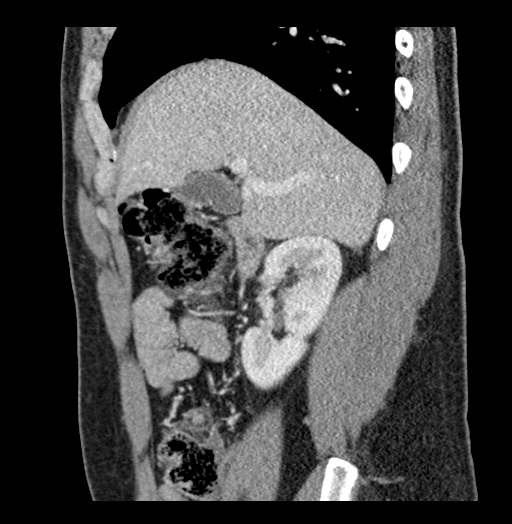

[13 of 46 positions shown; findings below may reference images not displayed]

FINDINGS: Lower chest: No acute findings.

Hepatobiliary: No hepatic masses identified. A few tiny sub-cm
calcified gallstones are noted, however there is no evidence of
cholecystitis or biliary ductal dilatation.

Pancreas:  No mass or inflammatory changes.

Spleen:  Within normal limits in size and appearance.

Adrenals/Urinary Tract: No masses identified. Tiny left renal cyst
noted. No evidence of hydronephrosis.

Stomach/Bowel: Visualized portion unremarkable. No evidence of
hiatal hernia. Normal appendix visualized.

Vascular/Lymphatic: No pathologically enlarged lymph nodes
identified. No acute vascular findings.

Other:  None.

Musculoskeletal:  No suspicious bone lesions identified.
IMPRESSION: Cholelithiasis. No radiographic evidence of cholecystitis or other
acute findings.

## 2023-07-08 ENCOUNTER — Encounter: Payer: Self-pay | Admitting: *Deleted

## 2024-01-30 ENCOUNTER — Encounter: Payer: Self-pay | Admitting: Family

## 2024-01-30 ENCOUNTER — Ambulatory Visit: Admitting: Family

## 2024-01-30 VITALS — BP 117/78 | HR 68 | Temp 98.3°F | Resp 16 | Ht 70.0 in | Wt 189.6 lb

## 2024-01-30 DIAGNOSIS — G8929 Other chronic pain: Secondary | ICD-10-CM

## 2024-01-30 DIAGNOSIS — M6530 Trigger finger, unspecified finger: Secondary | ICD-10-CM | POA: Diagnosis not present

## 2024-01-30 DIAGNOSIS — Z130 Encounter for screening for diseases of the blood and blood-forming organs and certain disorders involving the immune mechanism: Secondary | ICD-10-CM

## 2024-01-30 DIAGNOSIS — F418 Other specified anxiety disorders: Secondary | ICD-10-CM

## 2024-01-30 DIAGNOSIS — M549 Dorsalgia, unspecified: Secondary | ICD-10-CM

## 2024-01-30 DIAGNOSIS — Z13220 Encounter for screening for lipoid disorders: Secondary | ICD-10-CM

## 2024-01-30 DIAGNOSIS — M19049 Primary osteoarthritis, unspecified hand: Secondary | ICD-10-CM | POA: Diagnosis not present

## 2024-01-30 DIAGNOSIS — Z131 Encounter for screening for diabetes mellitus: Secondary | ICD-10-CM

## 2024-01-30 DIAGNOSIS — Z0000 Encounter for general adult medical examination without abnormal findings: Secondary | ICD-10-CM

## 2024-01-30 DIAGNOSIS — Z13228 Encounter for screening for other metabolic disorders: Secondary | ICD-10-CM

## 2024-01-30 DIAGNOSIS — Z1211 Encounter for screening for malignant neoplasm of colon: Secondary | ICD-10-CM

## 2024-01-30 DIAGNOSIS — Z7689 Persons encountering health services in other specified circumstances: Secondary | ICD-10-CM

## 2024-01-30 DIAGNOSIS — Z114 Encounter for screening for human immunodeficiency virus [HIV]: Secondary | ICD-10-CM

## 2024-01-30 DIAGNOSIS — F32A Depression, unspecified: Secondary | ICD-10-CM

## 2024-01-30 DIAGNOSIS — Z1329 Encounter for screening for other suspected endocrine disorder: Secondary | ICD-10-CM

## 2024-01-30 DIAGNOSIS — Z1159 Encounter for screening for other viral diseases: Secondary | ICD-10-CM

## 2024-01-30 DIAGNOSIS — Z0001 Encounter for general adult medical examination with abnormal findings: Secondary | ICD-10-CM | POA: Diagnosis not present

## 2024-01-30 DIAGNOSIS — Z23 Encounter for immunization: Secondary | ICD-10-CM

## 2024-01-30 MED ORDER — TRIAMCINOLONE ACETONIDE 40 MG/ML IJ SUSP
40.0000 mg | Freq: Once | INTRAMUSCULAR | Status: AC
  Administered 2024-01-30: 40 mg via INTRAMUSCULAR

## 2024-01-30 NOTE — Progress Notes (Signed)
 Patient is establishing care, patient wants an annual physical,  patient has chronic back issues and arthritis in hands, patient scored a 12 on the PHQ-9,  Patient scored a 16 on the GAD-7

## 2024-01-30 NOTE — Addendum Note (Signed)
 Addended by: Aadvika Konen on: 01/30/2024 03:11 PM   Modules accepted: Orders

## 2024-01-30 NOTE — Progress Notes (Signed)
 "  Subjective:    Jeffrey Kim - 49 y.o. male MRN [id number]  Date of birth: [birth year]  HPI  Jeffrey Kim is to establish care and annual exam. He is accompanied by his wife.  Current issues and/or concerns: - Due for colon cancer screening.  - Due for HIV and hepatitis C screening.  - States bilateral hand arthritis and sometimes fingers get stuck and has to reposition manually. Denies recent trauma/injury and red flag symptoms.  - Chronic back pain. Denies recent trauma/injury and red flag symptoms. Taking over-the-counter medication with some relief.  - Anxiety depression. He denies thoughts of self-harm, suicidal ideations, homicidal ideations.  ROS per HPI   Health Maintenance:  Health Maintenance Due  Topic Date Due   HIV Screening  Never done   Hepatitis C Screening  Never done   DTaP/Tdap/Td (1 - Tdap) Never done   Colonoscopy  Never done     Past Medical History: Patient Active Problem List   Diagnosis Date Noted   Painful skin lesion 12/24/2020   Macrocytic anemia 12/24/2020   Furuncle of buttock 12/24/2020   Congestion of nasal sinus 12/24/2020   Epigastric pain 12/12/2020   Abnormal weight loss 12/12/2020   Borborygmi 12/12/2020   Nausea 12/12/2020   Arthritis 11/23/2020   Asthma 04/10/2014      Social History   reports that he has been smoking e-cigarettes and cigars. He has never used smokeless tobacco. He reports current drug use. Drug: Marijuana. He reports that he does not drink alcohol.   Family History  family history includes Hypertension in his brother and mother; Stroke in his maternal grandmother.   Medications: reviewed and updated   Objective:   Physical Exam BP 117/78   Pulse 68   Temp 98.3 F (36.8 C) (Oral)   Resp 16   Ht 5' 10 (1.778 m)   Wt 189 lb 9.6 oz (86 kg)   SpO2 96%   BMI 27.20 kg/m   Physical Exam HENT:     Head: Normocephalic and atraumatic.     Right Ear: Tympanic membrane, ear canal and  external ear normal.     Left Ear: Tympanic membrane, ear canal and external ear normal.     Nose: Nose normal.     Mouth/Throat:     Mouth: Mucous membranes are moist.     Pharynx: Oropharynx is clear.  Eyes:     Extraocular Movements: Extraocular movements intact.     Conjunctiva/sclera: Conjunctivae normal.     Pupils: Pupils are equal, round, and reactive to light.  Neck:     Thyroid: No thyroid mass, thyromegaly or thyroid tenderness.  Cardiovascular:     Rate and Rhythm: Normal rate and regular rhythm.     Pulses: Normal pulses.     Heart sounds: Normal heart sounds.  Pulmonary:     Effort: Pulmonary effort is normal.     Breath sounds: Normal breath sounds.  Abdominal:     General: Bowel sounds are normal.     Palpations: Abdomen is soft.  Genitourinary:    Comments: Patient declined.  Musculoskeletal:        General: Normal range of motion.     Right shoulder: Normal.     Left shoulder: Normal.     Right upper arm: Normal.     Left upper arm: Normal.     Right elbow: Normal.     Left elbow: Normal.     Right forearm: Normal.  Left forearm: Normal.     Right wrist: Normal.     Left wrist: Normal.     Right hand: Normal.     Left hand: Normal.     Cervical back: Normal, normal range of motion and neck supple.     Thoracic back: Normal.     Lumbar back: Normal.     Right hip: Normal.     Left hip: Normal.     Right upper leg: Normal.     Left upper leg: Normal.     Right knee: Normal.     Left knee: Normal.     Right lower leg: Normal.     Left lower leg: Normal.     Right ankle: Normal.     Left ankle: Normal.     Right foot: Normal.     Left foot: Normal.  Skin:    General: Skin is warm and dry.     Capillary Refill: Capillary refill takes less than 2 seconds.  Neurological:     General: No focal deficit present.     Mental Status: He is alert and oriented to person, place, and time.  Psychiatric:        Mood and Affect: Mood normal.         Behavior: Behavior normal.    Assessment & Plan:  1. Encounter to establish care (Primary) 2. Annual physical exam - Counseled on 150 minutes of exercise per week as tolerated, healthy eating (including decreased daily intake of saturated fats, cholesterol, added sugars, sodium), STI prevention, and routine healthcare maintenance.  3. Screening for metabolic disorder - Routine screening.  - CMP14+EGFR  4. Screening for deficiency anemia - Routine screening.  - CBC  5. Diabetes mellitus screening - Routine screening.  - Hemoglobin A1c  6. Screening cholesterol level - Routine screening.  - Lipid panel  7. Thyroid disorder screen - Routine screening.  - TSH  8. Encounter for screening for HIV - Routine screening.  - HIV antibody (with reflex)  9. Need for hepatitis C screening test - Routine screening.  - Hepatitis C Antibody  10. Colon cancer screening - Referral to Gastroenterology for colon cancer screening by colonoscopy. - Ambulatory referral to Gastroenterology  11. Chronic back pain, unspecified back location, unspecified back pain laterality - Triamcinolone Acetonide injection administered in office.  - Referral to Orthopedic Surgery for evaluation/management.  - Follow-up with primary provider as scheduled.  - Ambulatory referral to Orthopedic Surgery - triamcinolone acetonide (KENALOG-40) injection 40 mg  12. Arthritis of hand, unspecified laterality 13. Trigger finger, unspecified finger, unspecified laterality - Patient declined pharmacological therapy.  - Referral to Orthopedic Surgery for evaluation/management.  - Ambulatory referral to Orthopedic Surgery  14. Anxiety and depression - Patient denies thoughts of self-harm, suicidal ideations, homicidal ideations. - Patient declined pharmacological therapy.  - Patient declined referral to Psychiatry.  - Follow-up with primary provider as scheduled.   15. Immunization due - Administered. - Tdap  vaccine greater than or equal to 7yo IM    Patient was given clear instructions to go to Emergency Department or return to medical center if symptoms don't improve, worsen, or new problems develop.The patient verbalized understanding.  I discussed the assessment and treatment plan with the patient. The patient was provided an opportunity to ask questions and all were answered. The patient agreed with the plan and demonstrated an understanding of the instructions.   The patient was advised to call back or seek an in-person evaluation if the  symptoms worsen or if the condition fails to improve as anticipated.    Greig Chute, NP 01/30/2024, 3:02 PM Primary Care at [facility]  "

## 2024-01-31 LAB — CMP14+EGFR
ALT: 7 IU/L (ref 0–44)
AST: 14 IU/L (ref 0–40)
Albumin: 4.1 g/dL (ref 4.1–5.1)
Alkaline Phosphatase: 64 IU/L (ref 47–123)
BUN/Creatinine Ratio: 10 (ref 9–20)
BUN: 11 mg/dL (ref 6–24)
Bilirubin Total: 0.5 mg/dL (ref 0.0–1.2)
CO2: 26 mmol/L (ref 20–29)
Calcium: 8.8 mg/dL (ref 8.7–10.2)
Chloride: 102 mmol/L (ref 96–106)
Creatinine, Ser: 1.15 mg/dL (ref 0.76–1.27)
Globulin, Total: 2.5 g/dL (ref 1.5–4.5)
Glucose: 82 mg/dL (ref 70–99)
Potassium: 4.3 mmol/L (ref 3.5–5.2)
Sodium: 141 mmol/L (ref 134–144)
Total Protein: 6.6 g/dL (ref 6.0–8.5)
eGFR: 79 mL/min/1.73

## 2024-01-31 LAB — CBC
Hematocrit: 37.4 % — ABNORMAL LOW (ref 37.5–51.0)
Hemoglobin: 12.3 g/dL — ABNORMAL LOW (ref 13.0–17.7)
MCH: 34.8 pg — ABNORMAL HIGH (ref 26.6–33.0)
MCHC: 32.9 g/dL (ref 31.5–35.7)
MCV: 106 fL — ABNORMAL HIGH (ref 79–97)
Platelets: 257 x10E3/uL (ref 150–450)
RBC: 3.53 x10E6/uL — ABNORMAL LOW (ref 4.14–5.80)
RDW: 11.3 % — ABNORMAL LOW (ref 11.6–15.4)
WBC: 4.6 x10E3/uL (ref 3.4–10.8)

## 2024-01-31 LAB — LIPID PANEL
Chol/HDL Ratio: 3.8 ratio (ref 0.0–5.0)
Cholesterol, Total: 162 mg/dL (ref 100–199)
HDL: 43 mg/dL
LDL Chol Calc (NIH): 107 mg/dL — ABNORMAL HIGH (ref 0–99)
Triglycerides: 59 mg/dL (ref 0–149)
VLDL Cholesterol Cal: 12 mg/dL (ref 5–40)

## 2024-01-31 LAB — TSH: TSH: 0.654 u[IU]/mL (ref 0.450–4.500)

## 2024-01-31 LAB — HEPATITIS C ANTIBODY: Hep C Virus Ab: NONREACTIVE

## 2024-01-31 LAB — HEMOGLOBIN A1C
Est. average glucose Bld gHb Est-mCnc: 97 mg/dL
Hgb A1c MFr Bld: 5 % (ref 4.8–5.6)

## 2024-01-31 LAB — HIV ANTIBODY (ROUTINE TESTING W REFLEX): HIV Screen 4th Generation wRfx: NONREACTIVE

## 2024-02-02 ENCOUNTER — Ambulatory Visit: Payer: Self-pay | Admitting: Family

## 2024-02-02 DIAGNOSIS — Z13220 Encounter for screening for lipoid disorders: Secondary | ICD-10-CM

## 2024-02-02 DIAGNOSIS — D649 Anemia, unspecified: Secondary | ICD-10-CM

## 2024-02-02 MED ORDER — IRON (FERROUS SULFATE) 325 (65 FE) MG PO TABS: 325.0000 mg | ORAL_TABLET | Freq: Every day | ORAL | 0 refills | Status: AC

## 2024-02-13 ENCOUNTER — Encounter: Payer: Self-pay | Admitting: Sports Medicine

## 2024-02-13 ENCOUNTER — Ambulatory Visit: Admitting: Sports Medicine

## 2024-02-13 ENCOUNTER — Other Ambulatory Visit (INDEPENDENT_AMBULATORY_CARE_PROVIDER_SITE_OTHER): Payer: Self-pay

## 2024-02-13 DIAGNOSIS — M79641 Pain in right hand: Secondary | ICD-10-CM | POA: Diagnosis not present

## 2024-02-13 DIAGNOSIS — M19042 Primary osteoarthritis, left hand: Secondary | ICD-10-CM | POA: Diagnosis not present

## 2024-02-13 DIAGNOSIS — M79642 Pain in left hand: Secondary | ICD-10-CM

## 2024-02-13 DIAGNOSIS — M25642 Stiffness of left hand, not elsewhere classified: Secondary | ICD-10-CM | POA: Diagnosis not present

## 2024-02-13 DIAGNOSIS — M19041 Primary osteoarthritis, right hand: Secondary | ICD-10-CM

## 2024-02-13 DIAGNOSIS — M25641 Stiffness of right hand, not elsewhere classified: Secondary | ICD-10-CM | POA: Diagnosis not present

## 2024-02-13 MED ORDER — MELOXICAM 15 MG PO TABS: 15.0000 mg | ORAL_TABLET | Freq: Every day | ORAL | 0 refills | Status: AC

## 2024-02-13 NOTE — Progress Notes (Signed)
 Patient says that he has had a fracture in his right hand, and a tendon reconstruction in his left hand, in his 34s. He says that in the last few years, he has had some trouble with both of the hands that has gotten worse over the last year. 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 This sticking and stiffness will happen with other daily activities as well, including fishing and gardening. He will use the other hand to extend his fingers and pop his knuckles. He does have some soreness through the palms, as well. Patient says that these symptoms are worse in the second, third, and fourth fingers of the right hand, and primarily the third finger on the left hand. He has not done anything to treat his symptoms.

## 2024-02-13 NOTE — Progress Notes (Signed)
 "  Jeffrey Kim - 49 y.o. male MRN [id number]  Date of birth: [birth year]  Office Visit Note: Visit Date: 02/13/2024 PCP: Jaycee Greig PARAS, NP Referred by: Jaycee Greig PARAS, NP  Subjective: Chief Complaint  Patient presents with   Right Hand - Pain   Left Hand - Pain   HPI: Jeffrey Kim is a pleasant 49 y.o. male who presents today for bilateral hand and finger pain, chronic in nature.  Discussed the use of AI scribe software for clinical note transcription with the patient, who gave verbal consent to proceed.  History of Present Illness Jeffrey Kim is a 49 year old male with prior bilateral hand trauma and left hand tendon reconstruction who presents with chronic bilateral hand pain and mechanical symptoms.  He has episodic pain, stiffness, and tenderness in both hands for 4 years, with progressive worsening over the past year. The right hand has constant tenderness at the second to fourth finger joints with intermittent sharp pain radiating into the hand. The left hand is mainly affected at the middle finger with pain in the palm and difficulty fully extending the finger due to tightness and tenderness, especially with stretching. Gripping and higher-force use increase pain, particularly in the left middle finger.  He has frequent mechanical symptoms with fingers locking in flexion that require manual manipulation to extend. These episodes have increased over the past year and now occur about 5 to 6 times per week, especially with repetitive activities such as scrubbing tanks at work. He also notes burning and stinging in the involved fingers during flares with repetitive use. Cracking his knuckles gives brief relief. He denies constant daily pain but has persistent tenderness and mechanical symptoms with activity.  He previously had right fifth metacarpal trauma that was not formally treated and a left hand laceration requiring tendon reconstruction of the middle and  ring fingers. He was told he likely has arthritis in both hands at the time of tendon surgery.  He works as a biomedical engineer tasks for long hours. He is not using regular anti-inflammatory medication for his hands, though he has taken Aleve in the past for back pain.    Pertinent ROS were reviewed with the patient and found to be negative unless otherwise specified above in HPI.   Assessment & Plan: Visit Diagnoses:  1. Primary osteoarthritis of both hands   2. Bilateral hand pain   3. 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 Do not think there is any true 'triggering.' Has underlying arthritic change but not severe in nature. Will start on short-term anti-inflammatory and have him get into our hand OT, Nate Mottola, to work on building a daily stretching, intrinsic hand exercise regimen and to prevent stiffness.  F/u in 6 weeks. Assessment & Plan Primary osteoarthritis of bilateral hands Mild to moderate osteoarthritic changes in mid-hand and metacarpophalangeal joints. Symptoms due to arthritic changes and mechanical irritation given repetetive nature of work and frequent hand use. Conservative management expected to reduce inflammation and improve function. - Reviewed bilateral hand radiographs showing mild to mild-moderate arthritic changes. - Provided education on osteoarthritis nature and progression. - Recommended short-term daily anti-inflammatory medication (Meloxicam ) for 2-3 weeks consistent and then taper to discontinue - Referred to hand-specific occupational therapy for stretching and strengthening exercises. - Advised follow-up in 6 weeks to reassess symptoms,  earlier if symptoms worsen.  History of boxer's fracture, right hand Remote boxer's fracture with residual radiographic changes, no current instability or functional impairment. Not  contributing to current symptoms. - Reviewed radiographs showing old boxer's fracture with residual changes, no acute issues. - Provided reassurance that prior fracture not expected to cause long-term problems or require further treatment.  Bilateral hand pain and stiffness Chronic bilateral hand pain and stiffness with episodes of finger triggering and burning pain, exacerbated by repetitive activities. Symptoms consistent with inflammatory flares and mechanical irritation related to osteoarthritis and occupational demands. - Prescribed short course of daily anti-inflammatory medication for 2-3 weeks. - Referred to hand occupational therapy for targeted exercises and ergonomic modifications. - Advised on activity modification as feasible given occupational demands. - Scheduled follow-up in 6 weeks to monitor progress and adjust management.   Follow-up: Return in about 6 weeks (around 03/26/2024), or if symptoms worsen or fail to improve, for Bilateral hand f/u.   Meds & Orders:  Meds ordered this encounter  Medications   meloxicam  (MOBIC ) 15 MG tablet    Sig: Take 1 tablet (15 mg total) by mouth daily.    Dispense:  30 tablet    Refill:  0    Orders Placed This Encounter  Procedures   XR Hand Complete Right   XR Hand Complete Left   Ambulatory referral to Occupational Therapy     Procedures: No procedures performed      Clinical History: No specialty comments available.  He reports that he has been smoking e-cigarettes and cigars. He has never used smokeless tobacco.  Recent Labs    01/30/24 1459  HGBA1C 5.0    Objective:    Physical Exam  Gen: Well-appearing, in no acute distress; non-toxic CV: Well-perfused. Warm.  Resp: Breathing unlabored on room air; no wheezing. Psych: Fluid speech in conversation; appropriate affect; normal thought process  *MSK/Ortho Exam: Physical Exam  Bilateral hands: + well-healed scar over dorsal aspect of left hand proximal to MCP of  2-4 digits. Mild bossing of PIP joints mostly 2-4 digits. No Dupuytren's contracture.  Full range of motion with opening close fist, does mild discomfort.  No active triggering noted on examination. CR < 2 secs.   MUSCULOSKELETAL: Tenderness in the right hand fingers and left middle finger. Range of motion of the hands is normal.  Imaging: XR Hand Complete Left Result Date: 02/13/2024 Three-view x-ray of bilateral hands including AP, lateral and oblique film were ordered and reviewed by myself today.  X-rays demonstrate mild to mild to moderate arthritic change of the finger joints bilaterally, PIP regions most involved.  No high-grade arthritic change noted.  The right pinky finger had evidence of previously healed boxer fracture with mild angulation.  XR Hand Complete Right Result Date: 02/13/2024 Three-view x-ray of bilateral hands including AP, lateral and oblique film were ordered and reviewed by myself today.  X-rays demonstrate mild to mild to moderate arthritic change of the finger joints bilaterally, PIP regions most involved.  No high-grade arthritic change noted.  The right pinky finger had evidence of previously healed boxer fracture with mild angulation.  Past Medical/Family/Surgical/Social History: Medications & Allergies reviewed per EMR, new medications updated. Patient Active Problem List   Diagnosis Date Noted   Painful skin lesion 12/24/2020   Macrocytic anemia 12/24/2020   Furuncle of buttock 12/24/2020   Congestion of nasal sinus 12/24/2020   Epigastric pain 12/12/2020   Abnormal weight loss 12/12/2020   Borborygmi 12/12/2020   Nausea 12/12/2020  Arthritis 11/23/2020   Asthma 04/10/2014   Past Medical History:  Diagnosis Date   Acid reflux    Anemia    Arthritis    Asthma    Family History  Problem Relation Age of Onset   Hypertension Mother    Hypertension Brother    Stroke Maternal Grandmother    Past Surgical History:  Procedure Laterality Date    hand surgey     Social History   Occupational History   Not on file  Tobacco Use   Smoking status: Some Days    Types: E-cigarettes, Cigars   Smokeless tobacco: Never  Vaping Use   Vaping status: Never Used  Substance and Sexual Activity   Alcohol use: No    Alcohol/week: 0.0 standard drinks of alcohol   Drug use: Yes    Types: Marijuana   Sexual activity: Not on file   "

## 2024-02-19 ENCOUNTER — Ambulatory Visit: Admitting: Physical Medicine and Rehabilitation

## 2024-02-19 ENCOUNTER — Other Ambulatory Visit: Payer: Self-pay

## 2024-02-19 ENCOUNTER — Encounter: Payer: Self-pay | Admitting: Physical Medicine and Rehabilitation

## 2024-02-19 DIAGNOSIS — G8929 Other chronic pain: Secondary | ICD-10-CM

## 2024-02-19 DIAGNOSIS — M5442 Lumbago with sciatica, left side: Secondary | ICD-10-CM | POA: Diagnosis not present

## 2024-02-19 DIAGNOSIS — M5416 Radiculopathy, lumbar region: Secondary | ICD-10-CM

## 2024-02-19 DIAGNOSIS — M5441 Lumbago with sciatica, right side: Secondary | ICD-10-CM | POA: Diagnosis not present

## 2024-02-19 NOTE — Progress Notes (Signed)
 Pain Scale   Average Pain 10 Patient advising he has chronic lower back pain radiating to right hip and leg pain increases at night        +Driver, -BT, -Dye Allergies.
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
## 2024-02-20 ENCOUNTER — Encounter: Payer: Self-pay | Admitting: Physical Medicine and Rehabilitation

## 2024-02-26 ENCOUNTER — Ambulatory Visit
Admission: RE | Admit: 2024-02-26 | Discharge: 2024-02-26 | Disposition: A | Discharge status: Home / Self Care | Source: Ambulatory Visit | Attending: Physical Medicine and Rehabilitation | Admitting: Physical Medicine and Rehabilitation

## 2024-02-26 DIAGNOSIS — G8929 Other chronic pain: Secondary | ICD-10-CM

## 2024-02-26 DIAGNOSIS — M5416 Radiculopathy, lumbar region: Secondary | ICD-10-CM

## 2024-02-27 ENCOUNTER — Other Ambulatory Visit: Payer: Self-pay

## 2024-02-27 ENCOUNTER — Ambulatory Visit: Attending: Sports Medicine

## 2024-02-27 DIAGNOSIS — M25641 Stiffness of right hand, not elsewhere classified: Secondary | ICD-10-CM | POA: Insufficient documentation

## 2024-02-27 DIAGNOSIS — M19042 Primary osteoarthritis, left hand: Secondary | ICD-10-CM | POA: Diagnosis not present

## 2024-02-27 DIAGNOSIS — M19041 Primary osteoarthritis, right hand: Secondary | ICD-10-CM | POA: Insufficient documentation

## 2024-02-27 DIAGNOSIS — M25531 Pain in right wrist: Secondary | ICD-10-CM | POA: Diagnosis present

## 2024-02-27 DIAGNOSIS — R208 Other disturbances of skin sensation: Secondary | ICD-10-CM | POA: Insufficient documentation

## 2024-02-27 DIAGNOSIS — M79642 Pain in left hand: Secondary | ICD-10-CM | POA: Insufficient documentation

## 2024-02-27 DIAGNOSIS — R29898 Other symptoms and signs involving the musculoskeletal system: Secondary | ICD-10-CM | POA: Insufficient documentation

## 2024-02-27 DIAGNOSIS — M79641 Pain in right hand: Secondary | ICD-10-CM | POA: Insufficient documentation

## 2024-02-27 DIAGNOSIS — R278 Other lack of coordination: Secondary | ICD-10-CM | POA: Diagnosis present

## 2024-02-27 DIAGNOSIS — M25532 Pain in left wrist: Secondary | ICD-10-CM | POA: Diagnosis present

## 2024-02-27 DIAGNOSIS — M25642 Stiffness of left hand, not elsewhere classified: Secondary | ICD-10-CM | POA: Diagnosis not present

## 2024-03-03 NOTE — Progress Notes (Unsigned)
 "  [facility]  [phone]   HEMATOLOGY ONCOLOGY CONSULTATION   Jeffrey Kim  DOB: 27-Nov-1975  MR#: [id number]  CSN#: [id number]    Requesting Physician: Greig Chute, NP  Patient Care Team: Chute Greig PARAS, NP as PCP - General (Nurse Practitioner)  Reason for consult: chronic macrocytic anemia   History of present illness:   Patient has medical history consistent with anxiety, arthritis, and macrocytic anemia.  He established with new primary care provider in early January.  Labs at that time indicated persistent macrocytic anemia with Hgb 12.3, HCT 37.4, MCV 106, and MCH 34.6.  Labs have been very similar to this dating back to 12/12/2020.  In 11/2020, additional workup was done showing low/normal vitamin B12 359 and normal folate at 4.7.  Patient states that until 01/2024, he did not know he had been anemic.  In addition to referral to hematology, patient's new primary care also referred him to GI.  Patient is overdue for colon cancer screening. Today, the patient presents with complaints of fatigue.  He states that previously, he had noted blood in his stool.  In more distant past, he had lost a great deal of blood which was present in the toilet bowl in addition to present on toilet tissue after having a bowel movement.  Mostly, the blood is bright red in color.  Sometimes it is darker.  He does report frequent abdominal pain.  This is mostly in the epigastric and right upper quadrant of the abdomen.  States he has many more days with pain than without.  Describes the pain as sharp and stabbing and coming in waves.  He will have periods of nausea.  Sometimes he he vomits.  Vomiting will wake him up from sleep.  This happens approximately 3 times per month.  He does not notice hematemesis however episodes of vomiting cause increased salivation and he does get a metallic taste in his mouth.  He has decreased appetite though his weight is stable.  Eats about once a day.   Able to tolerate most foods however, if his belly is bothering him, spicy foods are avoided.  He does eat more junk food than anything else.  He states that bland foods and foods that are high in salt content are better tolerated on his belly than other foods.  He denies pica.  He denies chest pain, chest pressure, or shortness of breath. He denies headaches or visual disturbances.  He denies other unusual bleeding such as hematuria or epistaxis. Socially, the patient is married and lives with his wife.  Together, they have 8 children.  The youngest 2 are twins and currently in college.  One of those twins lives at home intermittently.  The patient states he works full-time at [employer].  He works very long hours, approximately 11 to 12 hours/day, 6 days/week.  He attributes his fatigue to his long hours.  He denies smoking cigarettes or using smokeless tobacco.  He denies alcohol use.  He does smoke marijuana frequently.  He denies other illicit or illegal drug use.                             MEDICAL HISTORY:  Past Medical History:  Diagnosis Date   Acid reflux    Anemia    Arthritis    Asthma     SURGICAL HISTORY: Past Surgical History:  Procedure Laterality Date   hand surgey  SOCIAL HISTORY: Social History   Socioeconomic History   Marital status: Married    Spouse name: Not on file   Number of children: Not on file   Years of education: Not on file   Highest education level: Not on file  Occupational History   Not on file  Tobacco Use   Smoking status: Some Days    Types: E-cigarettes, Cigars   Smokeless tobacco: Never  Vaping Use   Vaping status: Never Used  Substance and Sexual Activity   Alcohol use: No    Alcohol/week: 0.0 standard drinks of alcohol   Drug use: Yes    Types: Marijuana   Sexual activity: Not on file  Other Topics Concern   Not on file  Social History Narrative   Not on file   Social Drivers of Health   Tobacco Use: High Risk (02/19/2024)    Patient History    Smoking Tobacco Use: Some Days    Smokeless Tobacco Use: Never    Passive Exposure: Not on file  Financial Resource Strain: Not on file  Food Insecurity: Food Insecurity Present (01/30/2024)   Epic    Worried About Programme Researcher, Broadcasting/film/video in the Last Year: Sometimes true    [employer] of Food in the Last Year: Sometimes true  Transportation Needs: No Transportation Needs (01/30/2024)   Epic    Lack of Transportation (Medical): No    Lack of Transportation (Non-Medical): No  Physical Activity: Not on file  Stress: Not on file  Social Connections: Moderately Integrated (01/30/2024)   Social Connection and Isolation Panel    Frequency of Communication with Friends and Family: More than three times a week    Frequency of Social Gatherings with Friends and Family: More than three times a week    Attends Religious Services: More than 4 times per year    Active Member of Clubs or Organizations: No    Attends Banker Meetings: Never    Marital Status: Married  Catering Manager Violence: Not At Risk (01/30/2024)   Epic    Fear of Current or Ex-Partner: No    Emotionally Abused: No    Physically Abused: No    Sexually Abused: No  Depression (PHQ2-9): Low Risk (03/04/2024)   Depression (PHQ2-9)    PHQ-2 Score: 0  Recent Concern: Depression (PHQ2-9) - High Risk (01/30/2024)   Depression (PHQ2-9)    PHQ-2 Score: 12  Alcohol Screen: Low Risk (01/30/2024)   Alcohol Screen    Last Alcohol Screening Score (AUDIT): 0  Housing: Unknown (01/30/2024)   Epic    Unable to Pay for Housing in the Last Year: No    Number of Times Moved in the Last Year: Not on file    Homeless in the Last Year: No  Utilities: Not At Risk (01/30/2024)   Epic    Threatened with loss of utilities: No  Health Literacy: Adequate Health Literacy (01/30/2024)   B1300 Health Literacy    Frequency of need for help with medical instructions: Never    FAMILY HISTORY: Family History  Problem Relation Age of  Onset   Hypertension Mother    Hypertension Brother    Stroke Maternal Grandmother     ALLERGIES:  is allergic to asa [aspirin] and shellfish allergy.  MEDICATIONS:  Current Outpatient Medications  Medication Sig Dispense Refill   cetirizine  (ZYRTEC ) 10 MG tablet Take 1 tablet (10 mg total) by mouth daily. (Patient not taking: Reported on 03/04/2024) 30 tablet 11  EPINEPHrine  0.3 mg/0.3 mL IJ SOAJ injection Inject 0.3 mg into the muscle as needed for anaphylaxis. 2 each 0   fluticasone  (FLONASE ) 50 MCG/ACT nasal spray Place 2 sprays into both nostrils daily. (Patient not taking: Reported on 03/04/2024) 16 g 0   Iron , Ferrous Sulfate , 325 (65 Fe) MG TABS Take 325 mg by mouth daily. 90 tablet 0   meloxicam  (MOBIC ) 15 MG tablet Take 1 tablet (15 mg total) by mouth daily. 30 tablet 0   naproxen sodium (ALEVE) 220 MG tablet Take 220 mg by mouth 2 (two) times daily as needed. (Patient not taking: Reported on 03/04/2024)     No current facility-administered medications for this visit.    REVIEW OF SYSTEMS:   Constitutional: Denies fevers or chills.  He does have frequent night sweats.  He has been so severe that he has to change his close during the night. Eyes: Denies blurriness of vision, double vision or watery eyes Ears, nose, mouth, throat, and face: Denies mucositis or sore throat Respiratory: Denies cough, dyspnea or wheezes Cardiovascular: Denies palpitation, chest discomfort or lower extremity swelling Gastrointestinal: He frequently has epigastric and right upper quadrant pain.  He also has frequent abdominal cramping.  He used to have chronic constipation.  Recently started on oral iron  which is improved his bowel routine.  He has nausea and will wake up 3 times per month from sleep with vomiting. Skin: Denies abnormal skin rashes Lymphatics: Denies new lymphadenopathy or easy bruising Neurological:Denies numbness, tingling or new weaknesses Behavioral/Psych: Mood is stable, no new  changes  All other systems were reviewed with the patient and are negative.  PHYSICAL EXAMINATION: ECOG PERFORMANCE STATUS: 1 - Symptomatic but completely ambulatory  Vitals:   03/04/24 1434  BP: 118/80  Resp: 17  Temp: 98 F (36.7 C)  SpO2: 99%   Filed Weights   03/04/24 1434  Weight: 190 lb 11.2 oz (86.5 kg)    GENERAL:alert, no distress and comfortable SKIN: skin color, texture, turgor are normal, no rashes or significant lesions EYES: normal, conjunctiva are pink and non-injected, sclera clear OROPHARYNX:no exudate, no erythema and lips, buccal mucosa, and tongue normal  NECK: supple, thyroid normal size, non-tender, without nodularity LYMPH:  no palpable lymphadenopathy in the cervical, axillary or inguinal LUNGS: clear to auscultation and percussion with normal breathing effort HEART: regular rate & rhythm and no murmurs and no lower extremity edema ABDOMEN:abdomen soft with normal bowel sounds.  There is generalized abdominal tenderness with palpation.  There is no acute abnormalities noted with rectal exam. Musculoskeletal:no cyanosis of digits and no clubbing  PSYCH: alert & oriented x 3 with fluent speech NEURO: no focal motor/sensory deficits  LABORATORY DATA:  I have reviewed the data as listed Lab Results  Component Value Date   WBC 5.5 03/04/2024   HGB 13.0 03/04/2024   HCT 38.3 (L) 03/04/2024   MCV 102.7 (H) 03/04/2024   PLT 197 03/04/2024   Recent Labs    01/30/24 1459  NA 141  K 4.3  CL 102  CO2 26  GLUCOSE 82  BUN 11  CREATININE 1.15  CALCIUM 8.8  PROT 6.6  ALBUMIN 4.1  AST 14  ALT 7  ALKPHOS 64  BILITOT 0.5    RADIOGRAPHIC STUDIES: I have personally reviewed the radiological images as listed and agreed with the findings in the report. MR LUMBAR SPINE WO CONTRAST Result Date: 02/29/2024 CLINICAL DATA:  Initial evaluation for chronic lower back pain and stiffness. EXAM: MRI LUMBAR SPINE WITHOUT  CONTRAST TECHNIQUE: Multiplanar,  multisequence MR imaging of the lumbar spine was performed. No intravenous contrast was administered. COMPARISON:  Radiograph from 02/19/2024. FINDINGS: Segmentation: Transitional features about the lumbosacral junction with a lumbarized S1 segment and rudimentary S1-2 interspace. Alignment: Physiologic with preservation of the normal lumbar lordosis. No listhesis. Vertebrae: Vertebral body height maintained without acute or chronic fracture. Bone marrow signal intensity within normal limits. No discrete or worrisome osseous lesions. Mild reactive marrow edema present about the right L5-S1 facet due to facet arthritis (series 307, image 4). Conus medullaris and cauda equina: Conus extends to the L1 level. Conus and cauda equina appear normal. Paraspinal and other soft tissues: Paraspinous soft tissues within normal limits. 1 cm simple cyst present within the interpolar left kidney, benign in appearance, no follow-up imaging recommended. Disc levels: L1-2:  Unremarkable. L2-3: Normal interspace. Mild bilateral facet spurring. No canal or foraminal stenosis. L3-4: Disc desiccation with mild diffuse disc bulge. Superimposed right extraforaminal disc protrusion contacts the exiting right L3 nerve root (series 313, image 18). Mild bilateral facet hypertrophy. Resultant mild narrowing of the left lateral recess. Central canal remains patent. No significant foraminal stenosis. L4-5: Disc desiccation with mild disc bulge. Mild facet and ligament flavum hypertrophy. No significant spinal stenosis. Mild to moderate bilateral L4 foraminal narrowing. L5-S1: Disc desiccation with diffuse disc bulge reactive endplate spurring. Superimposed right foraminal disc protrusion with slight superior migration (series 313, image 32). Moderate right worse than left facet hypertrophy with small joint effusions and mild reactive marrow edema on the right. Resultant mild narrowing of the right lateral recess. Central canal remains patent.  Moderate right with mild left L5 foraminal stenosis. S1-2: Transitional lumbosacral anatomy with rudimentary S1-2 interspace. No disc bulge or focal disc herniation. Mild facet degeneration. No stenosis. IMPRESSION: 1. Right foraminal disc protrusion with resultant moderate right L5 foraminal stenosis. 2. Right extraforaminal disc protrusion at L3-4, contacting and potentially affecting the exiting right L3 nerve root. 3. Disc bulge with facet hypertrophy at L4-5 with resultant mild to moderate bilateral L4 foraminal stenosis. 4. Moderate facet hypertrophy at L5-S1, worse on the right where there is associated joint effusion and mild reactive marrow edema. Finding could contribute to lower back pain. 5. 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 Careful correlation with numbering system on this exam recommended prior to any potential future intervention. Electronically Signed   By: Morene Hoard M.D.   On: 02/29/2024 05:08   XR Lumbar Spine 2-3 Views Result Date: 02/19/2024 AP and lateral radiographs of lumbar spine show normal segmentation and alignment. There is mild anterolisthesis of L4 on L5. Biformainal narrowing to lower lumbar spine. No fractures.   XR Hand Complete Left Result Date: 02/13/2024 Three-view x-ray of bilateral hands including AP, lateral and oblique film were ordered and reviewed by myself today.  X-rays demonstrate mild to mild to moderate arthritic change of the finger joints bilaterally, PIP regions most involved.  No high-grade arthritic change noted.  The right pinky finger had evidence of previously healed boxer fracture with mild angulation.  XR Hand Complete Right Result Date: 02/13/2024 Three-view x-ray of bilateral hands including AP, lateral and oblique film were ordered and reviewed by myself today.  X-rays demonstrate mild to mild to moderate arthritic change of the finger joints bilaterally, PIP regions most involved.  No high-grade arthritic  change noted.  The right pinky finger had evidence of previously healed boxer fracture with mild angulation.   ASSESSMENT & PLAN:  Macrocytic anemia Assessment &  Plan: The patient presented to new pcp in early January 2026. Labs at that time indicated persistent macrocytic anemia with Hgb 12.3, HCT 37.4, MCV 106, and MCH 34.6.  Labs have been very similar to this dating back to 12/12/2020.  In 11/2020, additional workup was done showing low/normal vitamin B12 at 359 and normal folate at 4.7.  Patient states that until 01/2024, he did not know he had been anemic.  In addition to referral to hematology, patient's new primary care also referred him to GI.  Patient is overdue for colon cancer screening. We discussed common causes of macrocytic anemia.  Will check B12 and folate levels.  Will also check full anemia panel, including iron  and ferritin levels.  Will add methylmalonic acid, if appointment, and testosterone.  Due to significant GI symptoms, will order CT abdomen and pelvis with contrast.  Will reach out to [facility] to see if colonoscopy and endoscopy can be performed in the near future.  Presentation of symptoms are highly concerning for a GI process.  Plan to follow-up with a phone visit in the next 3 to 4 weeks and review all results for further care.  Orders: -     Vitamin B12; Future -     Folate; Future -     Reticulocytes; Future -     Iron  and Iron  Binding Capacity ([facility] only); Future -     Ferritin; Future -     CBC with Differential (Cancer Center Only); Future -     TSH; Future -     Erythropoietin; Future -     Methylmalonic acid, serum; Future -     Testosterone, Free, Total, SHBG; Future -     Lipid panel; Future -     CT ABDOMEN PELVIS W WO CONTRAST; Future  Abdominal pain of multiple sites -     CT ABDOMEN PELVIS W WO CONTRAST; Future   This was a shared visit with Dr. Lanny. All questions were answered. The patient knows to call the clinic with any problems,  questions or concerns.      Powell FORBES Lessen, NP 03/04/2024 5:52 PM   "

## 2024-03-04 ENCOUNTER — Inpatient Hospital Stay: Admitting: Nurse Practitioner

## 2024-03-04 ENCOUNTER — Inpatient Hospital Stay

## 2024-03-04 VITALS — BP 118/80 | Temp 98.0°F | Resp 17 | Wt 190.7 lb

## 2024-03-04 DIAGNOSIS — D539 Nutritional anemia, unspecified: Secondary | ICD-10-CM

## 2024-03-04 DIAGNOSIS — R1085 Abdominal pain of multiple sites: Secondary | ICD-10-CM

## 2024-03-04 DIAGNOSIS — Z13220 Encounter for screening for lipoid disorders: Secondary | ICD-10-CM

## 2024-03-04 LAB — CBC WITH DIFFERENTIAL (CANCER CENTER ONLY)
Abs Immature Granulocytes: 0.01 10*3/uL (ref 0.00–0.07)
Basophils Absolute: 0 10*3/uL (ref 0.0–0.1)
Basophils Relative: 0 %
Eosinophils Absolute: 0.1 10*3/uL (ref 0.0–0.5)
Eosinophils Relative: 1 %
HCT: 38.3 % — ABNORMAL LOW (ref 39.0–52.0)
Hemoglobin: 13 g/dL (ref 13.0–17.0)
Immature Granulocytes: 0 %
Lymphocytes Relative: 34 %
Lymphs Abs: 1.9 10*3/uL (ref 0.7–4.0)
MCH: 34.9 pg — ABNORMAL HIGH (ref 26.0–34.0)
MCHC: 33.9 g/dL (ref 30.0–36.0)
MCV: 102.7 fL — ABNORMAL HIGH (ref 80.0–100.0)
Monocytes Absolute: 0.3 10*3/uL (ref 0.1–1.0)
Monocytes Relative: 6 %
Neutro Abs: 3.2 10*3/uL (ref 1.7–7.7)
Neutrophils Relative %: 59 %
Platelet Count: 197 10*3/uL (ref 150–400)
RBC: 3.73 MIL/uL — ABNORMAL LOW (ref 4.22–5.81)
RDW: 12.2 % (ref 11.5–15.5)
WBC Count: 5.5 10*3/uL (ref 4.0–10.5)
nRBC: 0 % (ref 0.0–0.2)

## 2024-03-04 LAB — LIPID PANEL
Cholesterol: 190 mg/dL (ref 0–200)
HDL: 58 mg/dL
LDL Cholesterol: 120 mg/dL — ABNORMAL HIGH (ref 0–99)
Total CHOL/HDL Ratio: 3.3 ratio
Triglycerides: 60 mg/dL
VLDL: 12 mg/dL (ref 0–40)

## 2024-03-04 LAB — VITAMIN B12: Vitamin B-12: 449 pg/mL (ref 180–914)

## 2024-03-04 LAB — RETICULOCYTES
Immature Retic Fract: 10.2 % (ref 2.3–15.9)
RBC.: 3.72 MIL/uL — ABNORMAL LOW (ref 4.22–5.81)
Retic Count, Absolute: 57.7 10*3/uL (ref 19.0–186.0)
Retic Ct Pct: 1.6 % (ref 0.4–3.1)

## 2024-03-04 LAB — IRON AND IRON BINDING CAPACITY (CC-WL,HP ONLY)
Iron: 99 ug/dL (ref 45–182)
Saturation Ratios: 28 % (ref 17.9–39.5)
TIBC: 358 ug/dL (ref 250–450)
UIBC: 260 ug/dL

## 2024-03-04 LAB — FOLATE: Folate: 3.6 ng/mL — ABNORMAL LOW

## 2024-03-04 LAB — FERRITIN: Ferritin: 153 ng/mL (ref 24–336)

## 2024-03-04 LAB — TSH: TSH: 0.57 u[IU]/mL (ref 0.350–4.500)

## 2024-03-04 NOTE — Assessment & Plan Note (Signed)
 The patient presented to new pcp in early January 2026. Labs at that time indicated persistent macrocytic anemia with Hgb 12.3, HCT 37.4, MCV 106, and MCH 34.6.  Labs have been very similar to this dating back to 12/12/2020.  In 11/2020, additional workup was done showing low/normal vitamin B12 at 359 and normal folate at 4.7.  Patient states that until 01/2024, he did not know he had been anemic.  In addition to referral to hematology, patient's new primary care also referred him to GI.  Patient is overdue for colon cancer screening. We discussed common causes of macrocytic anemia.  Will check B12 and folate levels.  Will also check full anemia panel, including iron  and ferritin levels.  Will add methylmalonic acid, if appointment, and testosterone.  Due to significant GI symptoms, will order CT abdomen and pelvis with contrast.  Will reach out to [facility] to see if colonoscopy and endoscopy can be performed in the near future.  Presentation of symptoms are highly concerning for a GI process.  Plan to follow-up with a phone visit in the next 3 to 4 weeks and review all results for further care.

## 2024-03-05 ENCOUNTER — Encounter: Payer: Self-pay | Admitting: Internal Medicine

## 2024-03-05 ENCOUNTER — Ambulatory Visit: Admitting: Occupational Therapy

## 2024-03-05 ENCOUNTER — Ambulatory Visit: Payer: Self-pay | Admitting: Family

## 2024-03-05 ENCOUNTER — Ambulatory Visit: Admitting: Physical Medicine and Rehabilitation

## 2024-03-05 DIAGNOSIS — R29898 Other symptoms and signs involving the musculoskeletal system: Secondary | ICD-10-CM

## 2024-03-05 DIAGNOSIS — M6281 Muscle weakness (generalized): Secondary | ICD-10-CM

## 2024-03-05 DIAGNOSIS — R208 Other disturbances of skin sensation: Secondary | ICD-10-CM

## 2024-03-05 DIAGNOSIS — M25531 Pain in right wrist: Secondary | ICD-10-CM

## 2024-03-05 DIAGNOSIS — M79641 Pain in right hand: Secondary | ICD-10-CM

## 2024-03-05 DIAGNOSIS — M79642 Pain in left hand: Secondary | ICD-10-CM

## 2024-03-05 DIAGNOSIS — M25532 Pain in left wrist: Secondary | ICD-10-CM

## 2024-03-05 DIAGNOSIS — R278 Other lack of coordination: Secondary | ICD-10-CM

## 2024-03-05 LAB — ERYTHROPOIETIN: Erythropoietin: 18.4 m[IU]/mL (ref 2.6–18.5)

## 2024-03-05 NOTE — Patient Instructions (Signed)
 SABRA

## 2024-03-05 NOTE — Therapy (Unsigned)
 " OUTPATIENT OCCUPATIONAL THERAPY ORTHO EVALUATION  Patient Name: Jeffrey Kim MRN: [id number] DOB:[birth year], 49 y.o., male Today's Date: 03/05/2024  PCP: Jaycee Greig PARAS, NP REFERRING PROVIDER: Burnetta Brunet, DO  END OF SESSION:  OT End of Session - 03/05/24 1355     Visit Number 2    Number of Visits 7   including eval   Date for Recertification  05/27/24    Authorization Type [id number]    Authorization Time Period Auth# [id number] OT visits 1/32-3/30    Authorization - Visit Number 2    Authorization - Number of Visits 5    OT Start Time 1400    OT Stop Time 1500    OT Time Calculation (min) 60 min    Activity Tolerance Patient tolerated treatment well    Behavior During Therapy [facility] for tasks assessed/performed          Past Medical History:  Diagnosis Date   Acid reflux    Anemia    Arthritis    Asthma    Past Surgical History:  Procedure Laterality Date   hand surgey     Patient Active Problem List   Diagnosis Date Noted   Painful skin lesion 12/24/2020   Macrocytic anemia 12/24/2020   Furuncle of buttock 12/24/2020   Congestion of nasal sinus 12/24/2020   Epigastric pain 12/12/2020   Abnormal weight loss 12/12/2020   Borborygmi 12/12/2020   Nausea 12/12/2020   Arthritis 11/23/2020   Asthma 04/10/2014    ONSET DATE: 02/13/2024 referral date  REFERRING DIAG: M19.041,M19.042 (ICD-10-CM) - Primary osteoarthritis of both hands M79.641,M79.642 (ICD-10-CM) - Bilateral hand pain M25.641,M25.642 (ICD-10-CM) - Stiffness of joints of both hands  THERAPY DIAG:  Pain in right hand  Pain in left hand  Muscle weakness (generalized)  Other symptoms and signs involving the musculoskeletal system  Other disturbances of skin sensation  Other lack of coordination  Pain in left wrist  Pain in right wrist  Rationale for Evaluation and Treatment: Rehabilitation  SUBJECTIVE:   SUBJECTIVE STATEMENT: My hands have gotten progressively worse since the  last 2-3 years. Pt accompanied by: significant other spouse Jeffrey Kim  PERTINENT HISTORY: asthma, arthritis, nausea  PRECAUTIONS: None  RED FLAGS: None   WEIGHT BEARING RESTRICTIONS: No  PAIN: Worst 8-9/10 Are you having pain? Yes: NPRS scale: R-4 L 2/10 Pain location: R hand, L hand is just stiff Pain description: dull, pulling, tight Aggravating factors: carrying 5 gallon buckets, reports sharp stabbing pain at times when carrying heavy things Relieving factors: movement (opening/closing the hand)  FALLS: Has patient fallen in last 6 months? 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 I've got to be able to cook and ride my horses.  NEXT MD VISIT: 03/04/24 oncologist  OBJECTIVE:  Note: Objective measures were completed at Evaluation unless otherwise noted.  HAND DOMINANCE: Ambidextrous, writes with the R hand but does most everything else with L hand  ADLs: WFL, is painful at times but does do it  FUNCTIONAL OUTCOME MEASURES: Quick Dash: 38.6/100   UPPER EXTREMITY ROM:     Active ROM Right eval Left eval  Shoulder flexion    Shoulder  abduction    Shoulder adduction    Shoulder extension    Shoulder internal rotation    Shoulder external rotation    Elbow flexion    Elbow extension    Wrist flexion 35 65  Wrist extension 25 50  Wrist ulnar deviation 10 35  Wrist radial deviation 12 14  Wrist pronation    Wrist supination    (Blank rows = not tested)  Active ROM Right eval Left eval  Thumb MCP (0-60)    Thumb IP (0-80)    Thumb Radial abd/add (0-55)     Thumb Palmar abd/add (0-45)     Thumb Opposition to Small Finger Unable  to oppose to small finger or long finger  WFL  Index MCP (0-90)     Index PIP (0-100)     Index DIP (0-70)      Long MCP (0-90)      Long PIP (0-100)      Long DIP (0-70)      Ring MCP (0-90)      Ring PIP (0-100)      Ring DIP (0-70)      Little MCP (0-90)      Little PIP (0-100)      Little DIP (0-70)      (Blank rows = not tested)   UPPER EXTREMITY MMT:     MMT Right eval Left eval  Shoulder flexion    Shoulder abduction    Shoulder adduction    Shoulder extension    Shoulder internal rotation    Shoulder external rotation    Middle trapezius    Lower trapezius    Elbow flexion    Elbow extension    Wrist flexion    Wrist extension    Wrist ulnar deviation    Wrist radial deviation    Wrist pronation    Wrist supination    (Blank rows = not tested)  HAND FUNCTION: Grip strength: Right: 33.8 avg lbs; Left: 57.7 avg lbs  COORDINATION: 9 Hole Peg test: Right: 27 sec; Left: 20 sec Box and Blocks:  Right 49blocks, Left 65 blocks  SENSATION: Occasional numbness/tingling in R hand in the middle of the palm or in the pinky. Massages hand to help with pain.  EDEMA: Pt reports occasional swelling, especially when carrying heavy things a lot during the day.  COGNITION: Overall cognitive status: Within functional limits for tasks assessed Areas of impairment: WNL  OBSERVATIONS: impaired ROM in B wrists and R hand, impaired grip strength and coordination, pain affecting ability to complete work and leisure tasks.    TREATMENT DATE: 02/27/24                                                                                                                           - Self-care/home management completed for duration as noted below including: Educated pt in purpose of OT, goals, and POC. Pt in agreement with plan      PATIENT  EDUCATION: Education details: see above Person educated: Patient and Spouse Education method: Explanation Education comprehension: verbalized  understanding  HOME EXERCISE PROGRAM:   GOALS: Goals reviewed with patient? Yes  SHORT TERM GOALS: Target date: 03/27/24  Patient will demonstrate RUE and LUE HEP for coordination, grip strength, and ROM with 25% verbal cues or less for proper execution.  Baseline: New to OP OT Goal status: INITIAL  2.  Pt will independently recall at least 3 joint protection, ergonomics, and body mechanic principles as noted in pt instructions.   Baseline: New to OP OT Goal status: INITIAL  3.  Pt will verbalize understanding of adapted strategies and/or equipment PRN to increase safety and independence with ADLs and IADLs (I.e. ergonomic handles for cooking, foam tubing)  Baseline: New to OP OT Goal status: INITIAL  4.  Pt will demonstrate improved ROM in R hand by demonstrating ability to oppose thumb to small finger and long finger Baseline:  Thumb Opposition to Small Finger Unable to oppose to small finger or long finger   Goal status: INITIAL   LONG TERM GOALS: Target date: 05/27/24  Patient will demonstrate at least 16% improvement with quick Dash score (reporting 22.6% disability or less) indicating improved functional use of affected extremity.  Baseline: 38.6/100  Goal status: INITIAL  2.  Pt will be able to place at least 55 blocks using right hand with completion of Box and Blocks test.  Baseline: Right 49blocks, Left 65 blocks Goal status: INITIAL  3.  Patient will demonstrate at least 43 lbs R grip strength as needed to open jars and other containers.  Baseline: Right: 33.8 avg lbs; Left: 57.7 avg lbs Goal status: INITIAL  4.  Pt will increase R wrist extension by at least 15 degrees Baseline:  Wrist extension 25 50   Goal status: INITIAL  5.  Pt will increase R ulnar deviation by at least 10 degrees Baseline:  Wrist ulnar deviation 10 35   Goal status: INITIAL  6.  Pt will increase R wrist flexion by at least 15 degrees Baseline:  Wrist flexion 35 65    Goal status: INITIAL  ASSESSMENT:  CLINICAL IMPRESSION: Patient is a 49 y.o. male who was seen today for occupational therapy evaluation for B hand pain in setting of primary OA. Patient currently presents slightly below baseline level of functioning demonstrating functional deficits and impairments as noted below. Pt would benefit from skilled OT services in the outpatient setting to work on impairments as noted below to help pt return to PLOF as able.     PERFORMANCE DEFICITS: in functional skills including ADLs, IADLs, coordination, dexterity, sensation, ROM, strength, pain, and UE functional use, and psychosocial skills including coping strategies, environmental adaptation, and habits.   IMPAIRMENTS: are limiting patient from IADLs, work, and leisure.   COMORBIDITIES: may have co-morbidities  that affects occupational performance. Patient will benefit from skilled OT to address above impairments and improve overall function.  MODIFICATION OR ASSISTANCE TO COMPLETE EVALUATION: Min-Moderate modification of tasks or assist with assess necessary to complete an evaluation.  OT OCCUPATIONAL PROFILE AND HISTORY: Problem focused assessment: Including review of records relating to presenting problem.  CLINICAL DECISION MAKING: LOW - limited treatment options, no task modification necessary  REHAB POTENTIAL: Good  EVALUATION COMPLEXITY: Low  PLAN:  OT FREQUENCY: 1x/week  OT DURATION: 6 weeks  PLANNED INTERVENTIONS: 97168 OT Re-evaluation, 97535 self care/ADL training, 02889 therapeutic exercise, 97530 therapeutic activity, 97140 manual therapy, 97018 paraffin, 02960 fluidotherapy, 97010 moist heat,  97010 cryotherapy, psychosocial skills training, energy conservation, coping strategies training, patient/family education, and DME and/or AE instructions  RECOMMENDED OTHER SERVICES:   CONSULTED AND AGREED WITH PLAN OF CARE: Patient and family member/caregiver  PLAN FOR NEXT SESSION:  heat modality (paraffin, fluido, or moist heat)  Wrist ROM stretches Joint protection strategies, especially with cooking tasks    Clarita LITTIE Pride, OT 03/05/2024, 1:56 PM   "

## 2024-03-11 ENCOUNTER — Other Ambulatory Visit

## 2024-03-18 ENCOUNTER — Ambulatory Visit

## 2024-03-23 ENCOUNTER — Encounter

## 2024-03-25 ENCOUNTER — Ambulatory Visit

## 2024-04-01 ENCOUNTER — Ambulatory Visit

## 2024-04-06 ENCOUNTER — Encounter: Admitting: Internal Medicine

## 2024-04-08 ENCOUNTER — Ambulatory Visit

## 2024-04-15 ENCOUNTER — Ambulatory Visit

## 2025-02-04 ENCOUNTER — Encounter: Payer: Self-pay | Admitting: Family
